# Patient Record
Sex: Male | Born: 1980
Health system: Southern US, Community
[De-identification: ages and names within clinical notes are randomized; demographics above are authoritative.]

## PROBLEM LIST (undated history)

## (undated) DIAGNOSIS — J449 Chronic obstructive pulmonary disease, unspecified: Secondary | ICD-10-CM

## (undated) DIAGNOSIS — U099 Post covid-19 condition, unspecified: Secondary | ICD-10-CM

## (undated) HISTORY — DX: Post covid-19 condition, unspecified: U09.9

---
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
## 2018-02-01 ENCOUNTER — Encounter (HOSPITAL_COMMUNITY): Payer: Self-pay | Admitting: Emergency Medicine

## 2018-02-01 ENCOUNTER — Emergency Department (HOSPITAL_COMMUNITY)
Admission: EM | Admit: 2018-02-01 | Discharge: 2018-02-01 | Disposition: A | Discharge status: Home / Self Care | Payer: No Typology Code available for payment source | Attending: Emergency Medicine | Admitting: Emergency Medicine

## 2018-02-01 ENCOUNTER — Emergency Department (HOSPITAL_COMMUNITY): Payer: No Typology Code available for payment source

## 2018-02-01 DIAGNOSIS — F17200 Nicotine dependence, unspecified, uncomplicated: Secondary | ICD-10-CM | POA: Insufficient documentation

## 2018-02-01 DIAGNOSIS — R0789 Other chest pain: Secondary | ICD-10-CM | POA: Insufficient documentation

## 2018-02-01 LAB — CBC
HCT: 47.3 % (ref 39.0–52.0)
Hemoglobin: 15.8 g/dL (ref 13.0–17.0)
MCH: 32 pg (ref 26.0–34.0)
MCHC: 33.4 g/dL (ref 30.0–36.0)
MCV: 95.9 fL (ref 78.0–100.0)
PLATELETS: 134 10*3/uL — AB (ref 150–400)
RBC: 4.93 MIL/uL (ref 4.22–5.81)
RDW: 12.6 % (ref 11.5–15.5)
WBC: 10.6 10*3/uL — AB (ref 4.0–10.5)

## 2018-02-01 LAB — BASIC METABOLIC PANEL
Anion gap: 9 (ref 5–15)
BUN: 13 mg/dL (ref 6–20)
CALCIUM: 9.3 mg/dL (ref 8.9–10.3)
CO2: 27 mmol/L (ref 22–32)
CREATININE: 0.98 mg/dL (ref 0.61–1.24)
Chloride: 100 mmol/L (ref 98–111)
Glucose, Bld: 113 mg/dL — ABNORMAL HIGH (ref 70–99)
Potassium: 3.6 mmol/L (ref 3.5–5.1)
Sodium: 136 mmol/L (ref 135–145)

## 2018-02-01 LAB — I-STAT TROPONIN, ED
TROPONIN I, POC: 0.01 ng/mL (ref 0.00–0.08)
Troponin i, poc: 0 ng/mL (ref 0.00–0.08)

## 2018-02-01 NOTE — Discharge Instructions (Addendum)
You were seen today for chest pain.  Your work-up is reassuring.  Follow-up with cardiology as needed.  You should abstain from smoking.

## 2018-02-01 NOTE — ED Triage Notes (Signed)
Patient reports intermittent mid/left chest pain for 2 days , mild SOB and occasional dry cough , denies emesis or diaphoresis , pain radiating to upper back .

## 2018-02-01 NOTE — ED Provider Notes (Signed)
[facility] EMERGENCY DEPARTMENT Provider Note   CSN: [id number] Arrival date & time: 02/01/18  0210     History   Chief Complaint Chief Complaint  Patient presents with  . Chest Pain    HPI Jason Hurst is a 37 y.o. male.  HPI  This is a 37 year old male who presents with chest pain.  Patient reports that earlier this evening he was watching TV when he had sudden onset of pressure-like chest pain at times radiated to his back.  Denies any ripping or tearing nature.  States that it seemed to come and go.  It was not associated with exertion or eating.  Denies any shortness of breath or diaphoresis.  No history of similar pain in the past.  Currently he is pain-free.  He reports last episode of chest pain was approximately 1 hour prior to arrival.  He has a history of smoking.  No known history of high blood pressure high cholesterol, diabetes, early family history of heart disease.  Patient denies any leg swelling, history of blood clots, recent travel, recent hospitalization.  History reviewed. No pertinent past medical history.  There are no active problems to display for this patient.   History reviewed. No pertinent surgical history.      Home Medications    Prior to Admission medications   Not on File    Family History No family history on file.  Social History Social History   Tobacco Use  . Smoking status: Current Every Day Smoker  . Smokeless tobacco: Never Used  Substance Use Topics  . Alcohol use: Yes  . Drug use: Yes    Types: Marijuana     Allergies   Patient has no known allergies.   Review of Systems Review of Systems  Constitutional: Negative for fever.  Respiratory: Negative for cough and shortness of breath.   Cardiovascular: Positive for chest pain. Negative for leg swelling.  Gastrointestinal: Negative for abdominal pain, nausea and vomiting.  Genitourinary: Negative for dysuria.  All other systems reviewed and  are negative.    Physical Exam Updated Vital Signs BP 113/81   Pulse (!) 50   Temp 97.7 F (36.5 C) (Oral)   Resp 15   Ht 1.778 m (5\' 10" )   Wt 88.5 kg   SpO2 98%   BMI 27.98 kg/m   Physical Exam  Constitutional: He is oriented to person, place, and time. He appears well-developed and well-nourished. No distress.  HENT:  Head: Normocephalic and atraumatic.  Eyes: Pupils are equal, round, and reactive to light.  Neck: Neck supple.  Cardiovascular: Normal rate, regular rhythm, normal heart sounds and normal pulses.  No murmur heard. Pulmonary/Chest: Effort normal and breath sounds normal. No respiratory distress. He has no wheezes.  Abdominal: Soft. Bowel sounds are normal. There is no tenderness. There is no rebound.  Musculoskeletal: He exhibits no edema.       Right lower leg: He exhibits no tenderness and no edema.       Left lower leg: He exhibits no tenderness and no edema.  Lymphadenopathy:    He has no cervical adenopathy.  Neurological: He is alert and oriented to person, place, and time.  Skin: Skin is warm and dry.  Psychiatric: He has a normal mood and affect.  Nursing note and vitals reviewed.    ED Treatments / Results  Labs (all labs ordered are listed, but only abnormal results are displayed) Labs Reviewed  BASIC METABOLIC PANEL - Abnormal;  Notable for the following components:      Result Value   Glucose, Bld 113 (*)    All other components within normal limits  CBC - Abnormal; Notable for the following components:   WBC 10.6 (*)    Platelets 134 (*)    All other components within normal limits  I-STAT TROPONIN, ED  I-STAT TROPONIN, ED    EKG EKG Interpretation  Date/Time:  Tuesday February 01 2018 02:11:54 EDT Ventricular Rate:  76 PR Interval:  142 QRS Duration: 94 QT Interval:  380 QTC Calculation: 427 R Axis:   80 Text Interpretation:  Normal sinus rhythm Normal ECG Confirmed by Ross Marcus ([id number]) on 02/01/2018 3:09:19  AM   Radiology Dg Chest 2 View  Result Date: 02/01/2018 CLINICAL DATA:  Chest pain EXAM: CHEST - 2 VIEW COMPARISON:  None. FINDINGS: Lungs are clear.  No pleural effusion or pneumothorax. The heart is normal in size. Visualized osseous structures are within normal limits. IMPRESSION: Normal chest radiographs. Electronically Signed   By: Charline Bills M.D.   On: 02/01/2018 02:47    Procedures Procedures (including critical care time)  Medications Ordered in ED Medications - No data to display   Initial Impression / Assessment and Plan / ED Course  I have reviewed the triage vital signs and the nursing notes.  Pertinent labs & imaging results that were available during my care of the patient were reviewed by me and considered in my medical decision making (see chart for details).     Patient presents with chest pain.  He is overall nontoxic-appearing and vital signs are reassuring.  EKG shows no signs of ischemia.  Patient is low risk for ACS.  Heart score is 1 for smoking.  Troponin is negative x2.  Patient is PERC negative.  X-ray shows no evidence of pneumothorax or pneumonia.  Patient has not had any recurrence of symptoms while in the ED.  Unclear etiology; however, feel patient is safe for discharge home and outpatient follow-up.  After history, exam, and medical workup I feel the patient has been appropriately medically screened and is safe for discharge home. Pertinent diagnoses were discussed with the patient. Patient was given return precautions.   Final Clinical Impressions(s) / ED Diagnoses   Final diagnoses:  Atypical chest pain    ED Discharge Orders    None       Shon Baton, MD 02/01/18 [phone]

## 2018-03-08 ENCOUNTER — Ambulatory Visit (INDEPENDENT_AMBULATORY_CARE_PROVIDER_SITE_OTHER): Payer: No Typology Code available for payment source | Admitting: Family Medicine

## 2018-03-08 ENCOUNTER — Encounter: Payer: Self-pay | Admitting: Family Medicine

## 2018-03-08 VITALS — BP 118/74 | HR 76 | Temp 98.0°F | Ht 70.0 in | Wt 207.4 lb

## 2018-03-08 DIAGNOSIS — Z0001 Encounter for general adult medical examination with abnormal findings: Secondary | ICD-10-CM | POA: Diagnosis not present

## 2018-03-08 DIAGNOSIS — Z716 Tobacco abuse counseling: Secondary | ICD-10-CM

## 2018-03-08 LAB — COMPREHENSIVE METABOLIC PANEL
ALT: 18 U/L (ref 0–53)
AST: 14 U/L (ref 0–37)
Albumin: 4.3 g/dL (ref 3.5–5.2)
Alkaline Phosphatase: 69 U/L (ref 39–117)
BILIRUBIN TOTAL: 0.6 mg/dL (ref 0.2–1.2)
BUN: 9 mg/dL (ref 6–23)
CALCIUM: 9.5 mg/dL (ref 8.4–10.5)
CO2: 27 meq/L (ref 19–32)
Chloride: 103 mEq/L (ref 96–112)
Creatinine, Ser: 0.89 mg/dL (ref 0.40–1.50)
GFR: 103.01 mL/min (ref >60.00)
Glucose, Bld: 113 mg/dL — ABNORMAL HIGH (ref 70–99)
Potassium: 4.1 mEq/L (ref 3.5–5.1)
Sodium: 138 mEq/L (ref 135–145)
Total Protein: 6.9 g/dL (ref 6.0–8.3)

## 2018-03-08 LAB — URINALYSIS, ROUTINE W REFLEX MICROSCOPIC
Bilirubin Urine: NEGATIVE
Hgb urine dipstick: NEGATIVE
Ketones, ur: NEGATIVE
Leukocytes, UA: NEGATIVE
Nitrite: NEGATIVE
RBC / HPF: NONE SEEN
Specific Gravity, Urine: 1.015 (ref 1.000–1.030)
Total Protein, Urine: NEGATIVE
Urine Glucose: NEGATIVE
Urobilinogen, UA: 0.2 (ref 0.0–1.0)
WBC, UA: NONE SEEN
pH: 8 (ref 5.0–8.0)

## 2018-03-08 LAB — CBC
HEMATOCRIT: 46.9 % (ref 39.0–52.0)
Hemoglobin: 16.2 g/dL (ref 13.0–17.0)
MCHC: 34.6 g/dL (ref 30.0–36.0)
MCV: 93.4 fl (ref 78.0–100.0)
PLATELETS: 131 10*3/uL — AB (ref 150.0–400.0)
RBC: 5.02 Mil/uL (ref 4.22–5.81)
RDW: 12.8 % (ref 11.5–15.5)
WBC: 7 10*3/uL (ref 4.0–10.5)

## 2018-03-08 LAB — LIPID PANEL
Cholesterol: 175 mg/dL (ref 0–200)
HDL: 42.9 mg/dL
LDL Cholesterol: 108 mg/dL — ABNORMAL HIGH (ref 0–99)
NonHDL: 132.42
Total CHOL/HDL Ratio: 4
Triglycerides: 120 mg/dL (ref 0.0–149.0)
VLDL: 24 mg/dL (ref 0.0–40.0)

## 2018-03-08 MED ORDER — VARENICLINE TARTRATE 0.5 MG X 11 & 1 MG X 42 PO MISC: ORAL | 0 refills | Status: DC

## 2018-03-08 NOTE — Patient Instructions (Signed)
Health Maintenance, Male A healthy lifestyle and preventive care is important for your health and wellness. Ask your health care provider about what schedule of regular examinations is right for you. What should I know about weight and diet? Eat a Healthy Diet  Eat plenty of vegetables, fruits, whole grains, low-fat dairy products, and lean protein.  Do not eat a lot of foods high in solid fats, added sugars, or salt.  Maintain a Healthy Weight Regular exercise can help you achieve or maintain a healthy weight. You should:  Do at least 150 minutes of exercise each week. The exercise should increase your heart rate and make you sweat (moderate-intensity exercise).  Do strength-training exercises at least twice a week.  Watch Your Levels of Cholesterol and Blood Lipids  Have your blood tested for lipids and cholesterol every 5 years starting at 35 years of age. If you are at high risk for heart disease, you should start having your blood tested when you are 35 years old. You may need to have your cholesterol levels checked more often if: ? Your lipid or cholesterol levels are high. ? You are older than 35 years of age. ? You are at high risk for heart disease.  What should I know about cancer screening? Many types of cancers can be detected early and may often be prevented. Lung Cancer  You should be screened every year for lung cancer if: ? You are a current smoker who has smoked for at least 30 years. ? You are a former smoker who has quit within the past 15 years.  Talk to your health care provider about your screening options, when you should start screening, and how often you should be screened.  Colorectal Cancer  Routine colorectal cancer screening usually begins at 35 years of age and should be repeated every 5-10 years until you are 35 years old. You may need to be screened more often if early forms of precancerous polyps or small growths are found. Your health care provider  may recommend screening at an earlier age if you have risk factors for colon cancer.  Your health care provider may recommend using home test kits to check for hidden blood in the stool.  A small camera at the end of a tube can be used to examine your colon (sigmoidoscopy or colonoscopy). This checks for the earliest forms of colorectal cancer.  Prostate and Testicular Cancer  Depending on your age and overall health, your health care provider may do certain tests to screen for prostate and testicular cancer.  Talk to your health care provider about any symptoms or concerns you have about testicular or prostate cancer.  Skin Cancer  Check your skin from head to toe regularly.  Tell your health care provider about any new moles or changes in moles, especially if: ? There is a change in a mole's size, shape, or color. ? You have a mole that is larger than a pencil eraser.  Always use sunscreen. Apply sunscreen liberally and repeat throughout the day.  Protect yourself by wearing long sleeves, pants, a wide-brimmed hat, and sunglasses when outside.  What should I know about heart disease, diabetes, and high blood pressure?  If you are 18-39 years of age, have your blood pressure checked every 3-5 years. If you are 40 years of age or older, have your blood pressure checked every year. You should have your blood pressure measured twice-once when you are at a hospital or clinic, and once when   you are not at a hospital or clinic. Record the average of the two measurements. To check your blood pressure when you are not at a hospital or clinic, you can use: ? An automated blood pressure machine at a pharmacy. ? A home blood pressure monitor.  Talk to your health care provider about your target blood pressure.  If you are between 83-34 years old, ask your health care provider if you should take aspirin to prevent heart disease.  Have regular diabetes screenings by checking your fasting blood  sugar level. ? If you are at a normal weight and have a low risk for diabetes, have this test once every three years after the age of 41. ? If you are overweight and have a high risk for diabetes, consider being tested at a younger age or more often.  A one-time screening for abdominal aortic aneurysm (AAA) by ultrasound is recommended for men aged 65-75 years who are current or former smokers. What should I know about preventing infection? Hepatitis B If you have a higher risk for hepatitis B, you should be screened for this virus. Talk with your health care provider to find out if you are at risk for hepatitis B infection. Hepatitis C Blood testing is recommended for:  Everyone born from [birth year] through 1963.  Anyone with known risk factors for hepatitis C.  Sexually Transmitted Diseases (STDs)  You should be screened each year for STDs including gonorrhea and chlamydia if: ? You are sexually active and are younger than 35 years of age. ? You are older than 35 years of age and your health care provider tells you that you are at risk for this type of infection. ? Your sexual activity has changed since you were last screened and you are at an increased risk for chlamydia or gonorrhea. Ask your health care provider if you are at risk.  Talk with your health care provider about whether you are at high risk of being infected with HIV. Your health care provider may recommend a prescription medicine to help prevent HIV infection.  What else can I do?  Schedule regular health, dental, and eye exams.  Stay current with your vaccines (immunizations).  Do not use any tobacco products, such as cigarettes, chewing tobacco, and e-cigarettes. If you need help quitting, ask your health care provider.  Limit alcohol intake to no more than 2 drinks per day. One drink equals 12 ounces of beer, 5 ounces of wine, or 1 ounces of hard liquor.  Do not use street drugs.  Do not share needles.  Ask your  health care provider for help if you need support or information about quitting drugs.  Tell your health care provider if you often feel depressed.  Tell your health care provider if you have ever been abused or do not feel safe at home. This information is not intended to replace advice given to you by your health care provider. Make sure you discuss any questions you have with your health care provider. Document Released: 10/17/2007 Document Revised: 12/18/2015 Document Reviewed: 01/22/2015 Elsevier Interactive Patient Education  2018 [employer].  Coping with Quitting Smoking Quitting smoking is a physical and mental challenge. You will face cravings, withdrawal symptoms, and temptation. Before quitting, work with your health care provider to make a plan that can help you cope. Preparation can help you quit and keep you from giving in. How can I cope with cravings? Cravings usually last for 5-10 minutes. If you get through  it, the craving will pass. Consider taking the following actions to help you cope with cravings:  Keep your mouth busy: ? Chew sugar-free gum. ? Suck on hard candies or a straw. ? Brush your teeth.  Keep your hands and body busy: ? Immediately change to a different activity when you feel a craving. ? Squeeze or play with a ball. ? Do an activity or a hobby, like making bead jewelry, practicing needlepoint, or working with wood. ? Mix up your normal routine. ? Take a short exercise break. Go for a quick walk or run up and down stairs. ? Spend time in public places where smoking is not allowed.  Focus on doing something kind or helpful for someone else.  Call a friend or family member to talk during a craving.  Join a support group.  Call a quit line, such as 1-800-QUIT-NOW.  Talk with your health care provider about medicines that might help you cope with cravings and make quitting easier for you.  How can I deal with withdrawal symptoms? Your body may  experience negative effects as it tries to get used to not having nicotine in the system. These effects are called withdrawal symptoms. They may include:  Feeling hungrier than normal.  Trouble concentrating.  Irritability.  Trouble sleeping.  Feeling depressed.  Restlessness and agitation.  Craving a cigarette.  To manage withdrawal symptoms:  Avoid places, people, and activities that trigger your cravings.  Remember why you want to quit.  Get plenty of sleep.  Avoid coffee and other caffeinated drinks. These may worsen some of your symptoms.  How can I handle social situations? Social situations can be difficult when you are quitting smoking, especially in the first few weeks. To manage this, you can:  Avoid parties, bars, and other social situations where people might be smoking.  Avoid alcohol.  Leave right away if you have the urge to smoke.  Explain to your family and friends that you are quitting smoking. Ask for understanding and support.  Plan activities with friends or family where smoking is not an option.  What are some ways I can cope with stress? Wanting to smoke may cause stress, and stress can make you want to smoke. Find ways to manage your stress. Relaxation techniques can help. For example:  Breathe slowly and deeply, in through your nose and out through your mouth.  Listen to soothing, relaxing music.  Talk with a family member or friend about your stress.  Light a candle.  Soak in a bath or take a shower.  Think about a peaceful place.  What are some ways I can prevent weight gain? Be aware that many people gain weight after they quit smoking. However, not everyone does. To keep from gaining weight, have a plan in place before you quit and stick to the plan after you quit. Your plan should include:  Having healthy snacks. When you have a craving, it may help to: ? Eat plain popcorn, crunchy carrots, celery, or other cut  vegetables. ? Chew sugar-free gum.  Changing how you eat: ? Eat small portion sizes at meals. ? Eat 4-6 small meals throughout the day instead of 1-2 large meals a day. ? Be mindful when you eat. Do not watch television or do other things that might distract you as you eat.  Exercising regularly: ? Make time to exercise each day. If you do not have time for a long workout, do short bouts of exercise for 5-10 minutes  several times a day. ? Do some form of strengthening exercise, like weight lifting, and some form of aerobic exercise, like running or swimming.  Drinking plenty of water or other low-calorie or no-calorie drinks. Drink 6-8 glasses of water daily, or as much as instructed by your health care provider.  Summary  Quitting smoking is a physical and mental challenge. You will face cravings, withdrawal symptoms, and temptation to smoke again. Preparation can help you as you go through these challenges.  You can cope with cravings by keeping your mouth busy (such as by chewing gum), keeping your body and hands busy, and making calls to family, friends, or a helpline for people who want to quit smoking.  You can cope with withdrawal symptoms by avoiding places where people smoke, avoiding drinks with caffeine, and getting plenty of rest.  Ask your health care provider about the different ways to prevent weight gain, avoid stress, and handle social situations. This information is not intended to replace advice given to you by your health care provider. Make sure you discuss any questions you have with your health care provider. Document Released: 04/17/2016 Document Revised: 04/17/2016 Document Reviewed: 04/17/2016 Elsevier Interactive Patient Education  [employer].

## 2018-03-08 NOTE — Progress Notes (Addendum)
Subjective:  Patient ID: Jason Hurst, male    DOB: 1980/11/03  Age: 37 y.o. MRN: [id number]  CC: Establish Care   HPI Jason Hurst presents for a physical exam and he is currently motivated to quit smoking.  He has been smoking for 15 years.  He is having no shortness of breath or dyspnea on exertion.  He is looking at his father and seeing himself when he gets to be his dad's age.  Dad is 83 and has drank and smoked heavily throughout his life.  He is currently dealing with sleep apnea, atrial fib hypertension and obesity.  Mom is about 60 as well.  She also drinks heavily.  Patient drinks very occasionally he occasionally smokes marijuana.  He lives with his wife and 27-year-old son.  He shares custody with his ex-4 year old son.  They are also raising a nephew.  Patient works in the Electrical engineer for [employer].  He he engages in no dedicated physical activity.  Patient is aware that his father smoking in lead to a lot of his personal problems.  No outpatient medications prior to visit.   No facility-administered medications prior to visit.     ROS Review of Systems  Constitutional: Negative.   HENT: Negative.   Eyes: Negative for photophobia and visual disturbance.  Respiratory: Negative.  Negative for chest tightness and shortness of breath.   Cardiovascular: Negative.   Gastrointestinal: Negative.   Endocrine: Negative for polyphagia and polyuria.  Genitourinary: Negative.   Musculoskeletal: Negative for gait problem and joint swelling.  Skin: Negative for pallor.  Neurological: Negative for weakness and light-headedness.  Hematological: Does not bruise/bleed easily.  Psychiatric/Behavioral: Negative.     Objective:  BP 118/74 (BP Location: Left Arm, Patient Position: Sitting, Cuff Size: Normal)   Pulse 76   Temp 98 F (36.7 C) (Oral)   Ht 5\' 10"  (1.778 m)   Wt 207 lb 6 oz (94.1 kg)   SpO2 98%   BMI 29.76 kg/m   BP Readings from Last 3 Encounters:    03/08/18 118/74  02/01/18 113/81    Wt Readings from Last 3 Encounters:  03/08/18 207 lb 6 oz (94.1 kg)  02/01/18 195 lb (88.5 kg)    Physical Exam  Constitutional: He is oriented to person, place, and time. He appears well-developed and well-nourished. No distress.  HENT:  Head: Normocephalic and atraumatic.  Right Ear: External ear normal.  Left Ear: External ear normal.  Nose: Nose normal.  Mouth/Throat: Oropharynx is clear and moist. No oropharyngeal exudate.  Eyes: Pupils are equal, round, and reactive to light. Conjunctivae and EOM are normal. Right eye exhibits no discharge. Left eye exhibits no discharge. No scleral icterus.  Neck: Neck supple. No JVD present. No tracheal deviation present. No thyromegaly present.  Cardiovascular: Normal rate, regular rhythm and normal heart sounds.  Pulmonary/Chest: Effort normal and breath sounds normal.  Abdominal: Soft. Bowel sounds are normal. He exhibits no distension and no mass. There is no tenderness. There is no guarding. Hernia confirmed negative in the right inguinal area and confirmed negative in the left inguinal area.  Genitourinary: Testes normal and penis normal. Right testis shows no mass, no swelling and no tenderness. Left testis shows no mass, no swelling and no tenderness. Circumcised. No hypospadias, penile erythema or penile tenderness. No discharge found.  Musculoskeletal: He exhibits no edema.  Lymphadenopathy:    He has no cervical adenopathy. No inguinal adenopathy noted on the right or left side.  Neurological: He is alert and oriented to person, place, and time.  Skin: Skin is warm and dry. He is not diaphoretic.  Psychiatric: He has a normal mood and affect. His behavior is normal. Thought content normal.   Depression screen [place] 2/9 03/08/2018  Decreased Interest 0  Down, Depressed, Hopeless 0  PHQ - 2 Score 0     Lab Results  Component Value Date   WBC 7.0 03/08/2018   HGB 16.2 03/08/2018   HCT 46.9  03/08/2018   PLT 131.0 (L) 03/08/2018   GLUCOSE 113 (H) 03/08/2018   CHOL 175 03/08/2018   TRIG 120.0 03/08/2018   HDL 42.90 03/08/2018   LDLCALC 108 (H) 03/08/2018   ALT 18 03/08/2018   AST 14 03/08/2018   NA 138 03/08/2018   K 4.1 03/08/2018   CL 103 03/08/2018   CREATININE 0.89 03/08/2018   BUN 9 03/08/2018   CO2 27 03/08/2018    Dg Chest 2 View  Result Date: 02/01/2018 CLINICAL DATA:  Chest pain EXAM: CHEST - 2 VIEW COMPARISON:  None. FINDINGS: Lungs are clear.  No pleural effusion or pneumothorax. The heart is normal in size. Visualized osseous structures are within normal limits. IMPRESSION: Normal chest radiographs. Electronically Signed   By: Charline Bills M.D.   On: 02/01/2018 02:47    Assessment & Plan:   Finlee was seen today for establish care.  Diagnoses and all orders for this visit:  Encounter for health maintenance examination with abnormal findings -     CBC -     Comprehensive metabolic panel -     Lipid panel -     Urinalysis, Routine w reflex microscopic  Tobacco abuse counseling -     varenicline (CHANTIX PAK) 0.5 MG X 11 & 1 MG X 42 tablet; Take one 0.5 mg tablet by mouth once daily for 3 days, then increase to one 0.5 mg tablet twice daily for 4 days, then increase to one 1 mg tablet twice daily.   I am having Jason Argyle. Profitt "JIMMY" start on varenicline.  Meds ordered this encounter  Medications  . varenicline (CHANTIX PAK) 0.5 MG X 11 & 1 MG X 42 tablet    Sig: Take one 0.5 mg tablet by mouth once daily for 3 days, then increase to one 0.5 mg tablet twice daily for 4 days, then increase to one 1 mg tablet twice daily.    Dispense:  53 tablet    Refill:  0   Patient was given a prescription for Chantix.  We discussed side effects of depression and/or homicidal thoughts.  He will return 1 month after starting the pack.  He is quite aware of the risks of smoking to include heart disease and lung cancer.  He wants to be alive to see his  grandchildren.  He was given anticipatory guidance for health maintenance and disease prevention.  Health maintenance labs are being drawn today.  Advised patient to engage in dedicated exercise for 30 minutes 5 days a week.  Follow-up: Return in about 6 months (around 09/06/2018), or return one month after starting chantix pack.Mliss Sax, MD

## 2018-03-16 ENCOUNTER — Encounter: Payer: Self-pay | Admitting: Family Medicine

## 2018-03-16 DIAGNOSIS — Z716 Tobacco abuse counseling: Secondary | ICD-10-CM

## 2018-03-16 MED ORDER — VARENICLINE TARTRATE 0.5 MG X 11 & 1 MG X 42 PO MISC: ORAL | 0 refills | Status: DC

## 2018-03-16 MED FILL — CHANTIX STARTING MONTH BOX: 0.5 MG X 11 | 30 days supply | Qty: 53 | Fill #0

## 2018-04-05 ENCOUNTER — Encounter: Payer: Self-pay | Admitting: Family Medicine

## 2018-04-06 ENCOUNTER — Ambulatory Visit: Payer: No Typology Code available for payment source | Admitting: Family Medicine

## 2018-04-07 ENCOUNTER — Ambulatory Visit: Payer: No Typology Code available for payment source | Admitting: Family Medicine

## 2018-04-28 ENCOUNTER — Encounter: Payer: Self-pay | Admitting: Family Medicine

## 2018-04-28 ENCOUNTER — Ambulatory Visit (INDEPENDENT_AMBULATORY_CARE_PROVIDER_SITE_OTHER): Payer: No Typology Code available for payment source | Admitting: Family Medicine

## 2018-04-28 VITALS — BP 118/80 | HR 92 | Ht 70.0 in | Wt 211.4 lb

## 2018-04-28 DIAGNOSIS — Z79899 Other long term (current) drug therapy: Secondary | ICD-10-CM

## 2018-04-28 DIAGNOSIS — Z720 Tobacco use: Secondary | ICD-10-CM

## 2018-04-28 MED ORDER — VARENICLINE TARTRATE 1 MG PO TABS: 1.0000 mg | ORAL_TABLET | Freq: Two times a day (BID) | ORAL | 1 refills | Status: AC

## 2018-04-28 MED FILL — CHANTIX 1 MG CONT MONTH BOX: 1 | 28 days supply | Qty: 56 | Fill #0

## 2018-04-28 NOTE — Progress Notes (Signed)
Established Patient Office Visit  Subjective:  Patient ID: Jason Hurst, male    DOB: May 11, 1980  Age: 37 y.o. MRN: [id number]  CC:  Chief Complaint  Patient presents with  . Follow-up    HPI Jason Hurst presents for   for follow-up status post starting Chantix 3 weeks ago.  Jason Hurst is doing okay with the drug.  Jason Hurst denies any depression or anger or homicidal thinking.  Jason Hurst continues to smoke some but it is much reduced amount.  Fortunately his insurance did cover the drug.  Remains busy at work.  His wife is happy that Jason Hurst is making a strong effort to quit.  On occasion Jason Hurst has some restlessness at night but overall is doing well on the drug.  History reviewed. No pertinent past medical history.  History reviewed. No pertinent surgical history.  History reviewed. No pertinent family history.  Social History   Socioeconomic History  . Marital status: Married    Spouse name: Not on file  . Number of children: Not on file  . Years of education: Not on file  . Highest education level: Not on file  Occupational History  . Not on file  Social Needs  . Financial resource strain: Not on file  . Food insecurity:    Worry: Not on file    Inability: Not on file  . Transportation needs:    Medical: Not on file    Non-medical: Not on file  Tobacco Use  . Smoking status: Current Every Day Smoker  . Smokeless tobacco: Never Used  Substance and Sexual Activity  . Alcohol use: Yes    Comment: very occasionally.   . Drug use: Yes    Types: Marijuana  . Sexual activity: Not on file  Lifestyle  . Physical activity:    Days per week: Not on file    Minutes per session: Not on file  . Stress: Not on file  Relationships  . Social connections:    Talks on phone: Not on file    Gets together: Not on file    Attends religious service: Not on file    Active member of club or organization: Not on file    Attends meetings of clubs or organizations: Not on file    Relationship status:  Not on file  . Intimate partner violence:    Fear of current or ex partner: Not on file    Emotionally abused: Not on file    Physically abused: Not on file    Forced sexual activity: Not on file  Other Topics Concern  . Not on file  Social History Narrative  . Not on file    Outpatient Medications Prior to Visit  Medication Sig Dispense Refill  . varenicline (CHANTIX PAK) 0.5 MG X 11 & 1 MG X 42 tablet Take 0.5 mg tab by mouth once for 3 days, then increase to one 0.5 mg tab bid daily for 4 days, then increase to one 1 mg tab bid daily. 53 tablet 0   No facility-administered medications prior to visit.     No Known Allergies  ROS Review of Systems  Constitutional: Negative.   Neurological: Negative.   Hematological: Negative.   Psychiatric/Behavioral: Negative for behavioral problems, dysphoric mood, self-injury and suicidal ideas. The patient is not nervous/anxious.       Objective:    Physical Exam  Constitutional: Jason Hurst is oriented to person, place, and time. Jason Hurst appears well-developed and well-nourished. No distress.  HENT:  Head: Normocephalic and atraumatic.  Right Ear: External ear normal.  Left Ear: External ear normal.  Eyes: Conjunctivae are normal. Right eye exhibits no discharge. Left eye exhibits no discharge. No scleral icterus.  Neck: No JVD present. No tracheal deviation present.  Pulmonary/Chest: Effort normal. No stridor.  Neurological: Jason Hurst is alert and oriented to person, place, and time.  Skin: Skin is warm and dry. Jason Hurst is not diaphoretic.  Psychiatric: Jason Hurst has a normal mood and affect. His behavior is normal.    BP 118/80   Pulse 92   Ht 5\' 10"  (1.778 m)   Wt 211 lb 6 oz (95.9 kg)   SpO2 95%   BMI 30.33 kg/m  Wt Readings from Last 3 Encounters:  04/28/18 211 lb 6 oz (95.9 kg)  03/08/18 207 lb 6 oz (94.1 kg)  02/01/18 195 lb (88.5 kg)   BP Readings from Last 3 Encounters:  04/28/18 118/80  03/08/18 118/74  02/01/18 113/81   Guideline  developer:  UpToDate (see UpToDate for funding source) Date Released: June 2014  Health Maintenance Due  Topic Date Due  . HIV Screening  07/17/1997  . TETANUS/TDAP  07/17/2001  . INFLUENZA VACCINE  12/02/2017    There are no preventive care reminders to display for this patient.  No results found for: TSH Lab Results  Component Value Date   WBC 7.0 03/08/2018   HGB 16.2 03/08/2018   HCT 46.9 03/08/2018   MCV 93.4 03/08/2018   PLT 131.0 (L) 03/08/2018   Lab Results  Component Value Date   NA 138 03/08/2018   K 4.1 03/08/2018   CO2 27 03/08/2018   GLUCOSE 113 (H) 03/08/2018   BUN 9 03/08/2018   CREATININE 0.89 03/08/2018   BILITOT 0.6 03/08/2018   ALKPHOS 69 03/08/2018   AST 14 03/08/2018   ALT 18 03/08/2018   PROT 6.9 03/08/2018   ALBUMIN 4.3 03/08/2018   CALCIUM 9.5 03/08/2018   ANIONGAP 9 02/01/2018   GFR 103.01 03/08/2018   Lab Results  Component Value Date   CHOL 175 03/08/2018   Lab Results  Component Value Date   HDL 42.90 03/08/2018   Lab Results  Component Value Date   LDLCALC 108 (H) 03/08/2018   Lab Results  Component Value Date   TRIG 120.0 03/08/2018   Lab Results  Component Value Date   CHOLHDL 4 03/08/2018   No results found for: HGBA1C    Assessment & Plan:   Problem List Items Addressed This Visit      Other   Tobacco abuse disorder - Primary   Relevant Medications   varenicline (CHANTIX) 1 MG tablet   On Chantix therapy      Meds ordered this encounter  Medications  . varenicline (CHANTIX) 1 MG tablet    Sig: Take 1 tablet (1 mg total) by mouth 2 (two) times daily.    Dispense:  60 tablet    Refill:  1    Follow-up: No follow-ups on file.   Patient will continue the Chantix for another month.  Jason Hurst is planning on not smoking past the first at all.  Jason Hurst will follow-up with me immediately with any depression or anger issues.  Anticipatory guidance was given again on Chantix.

## 2018-04-28 NOTE — Patient Instructions (Signed)
Varenicline oral tablets What is this medicine? VARENICLINE (var EN i kleen) is used to help people quit smoking. It is used with a patient support program recommended by your physician. This medicine may be used for other purposes; ask your health care provider or pharmacist if you have questions. COMMON BRAND NAME(S): Chantix What should I tell my health care provider before I take this medicine? They need to know if you have any of these conditions: -heart disease -if you often drink alcohol -kidney disease -mental illness -on hemodialysis -seizures -history of stroke -suicidal thoughts, plans, or attempt; a previous suicide attempt by you or a family member -an unusual or allergic reaction to varenicline, other medicines, foods, dyes, or preservatives -pregnant or trying to get pregnant -breast-feeding How should I use this medicine? Take this medicine by mouth after eating. Take with a full glass of water. Follow the directions on the prescription label. Take your doses at regular intervals. Do not take your medicine more often than directed. There are 3 ways you can use this medicine to help you quit smoking; talk to your health care professional to decide which plan is right for you: 1) you can choose a quit date and start this medicine 1 week before the quit date, or, 2) you can start taking this medicine before you choose a quit date, and then pick a quit date between day 8 and 35 days of treatment, or, 3) if you are not sure that you are able or willing to quit smoking right away, start taking this medicine and slowly decrease the amount you smoke as directed by your health care professional with the goal of being cigarette-free by week 12 of treatment. Stick to your plan; ask about support groups or other ways to help you remain cigarette-free. If you are motivated to quit smoking and did not succeed during a previous attempt with this medicine for reasons other than side effects,  or if you returned to smoking after this treatment, speak with your health care professional about whether another course of this medicine may be right for you. A special MedGuide will be given to you by the pharmacist with each prescription and refill. Be sure to read this information carefully each time. Talk to your pediatrician regarding the use of this medicine in children. This medicine is not approved for use in children. Overdosage: If you think you have taken too much of this medicine contact a poison control center or emergency room at once. NOTE: This medicine is only for you. Do not share this medicine with others. What if I miss a dose? If you miss a dose, take it as soon as you can. If it is almost time for your next dose, take only that dose. Do not take double or extra doses. What may interact with this medicine? -alcohol -insulin -other medicines used to help people quit smoking -theophylline -warfarin This list may not describe all possible interactions. Give your health care provider a list of all the medicines, herbs, non-prescription drugs, or dietary supplements you use. Also tell them if you smoke, drink alcohol, or use illegal drugs. Some items may interact with your medicine. What should I watch for while using this medicine? It is okay if you do not succeed at your attempt to quit and have a cigarette. You can still continue your quit attempt and keep using this medicine as directed. Just throw away your cigarettes and get back to your quit plan. Talk to your health   care provider before using other treatments to quit smoking. Using this medicine with other treatments to quit smoking may increase the risk for side effects compared to using a treatment alone. You may get drowsy or dizzy. Do not drive, use machinery, or do anything that needs mental alertness until you know how this medicine affects you. Do not stand or sit up quickly, especially if you are an older patient.  This reduces the risk of dizzy or fainting spells. Decrease the number of alcoholic beverages that you drink during treatment with this medicine until you know if this medicine affects your ability to tolerate alcohol. Some people have experienced increased drunkenness (intoxication), unusual or sometimes aggressive behavior, or no memory of things that have happened (amnesia) during treatment with this medicine. Sleepwalking can happen during treatment with this medicine, and can sometimes lead to behavior that is harmful to you, other people, or property. Stop taking this medicine and tell your doctor if you start sleepwalking or have other unusual sleep-related activity. After taking this medicine, you may get up out of bed and do an activity that you do not know you are doing. The next morning, you may have no memory of this. Activities include driving a car ("sleep-driving"), making and eating food, talking on the phone, sexual activity, and sleep-walking. Serious injuries have occurred. Stop the medicine and call your doctor right away if you find out you have done any of these activities. Do not take this medicine if you have used alcohol that evening. Do not take it if you have taken another medicine for sleep. The risk of doing these sleep-related activities is higher. Patients and their families should watch out for new or worsening depression or thoughts of suicide. Also watch out for sudden changes in feelings such as feeling anxious, agitated, panicky, irritable, hostile, aggressive, impulsive, severely restless, overly excited and hyperactive, or not being able to sleep. If this happens, call your health care professional. If you have diabetes and you quit smoking, the effects of insulin may be increased and you may need to reduce your insulin dose. Check with your doctor or health care professional about how you should adjust your insulin dose. What side effects may I notice from receiving this  medicine? Side effects that you should report to your doctor or health care professional as soon as possible: -allergic reactions like skin rash, itching or hives, swelling of the face, lips, tongue, or throat -acting aggressive, being angry or violent, or acting on dangerous impulses -breathing problems -changes in emotions or moods -chest pain or chest tightness -feeling faint or lightheaded, falls -hallucination, loss of contact with reality -mouth sores -redness, blistering, peeling or loosening of the skin, including inside the mouth -signs and symptoms of a stroke like changes in vision; confusion; trouble speaking or understanding; severe headaches; sudden numbness or weakness of the face, arm or leg; trouble walking; dizziness; loss of balance or coordination -seizures -sleepwalking -suicidal thoughts or other mood changes Side effects that usually do not require medical attention (report to your doctor or health care professional if they continue or are bothersome): -constipation -gas -headache -nausea, vomiting -strange dreams -trouble sleeping This list may not describe all possible side effects. Call your doctor for medical advice about side effects. You may report side effects to FDA at 1-800-FDA-1088. Where should I keep my medicine? Keep out of the reach of children. Store at room temperature between 15 and 30 degrees C (59 and 86 degrees F). Throw away   any unused medicine after the expiration date. NOTE: This sheet is a summary. It may not cover all possible information. If you have questions about this medicine, talk to your doctor, pharmacist, or health care provider.  2019 Elsevier/Gold Standard (2017-10-15 12:48:08)  

## 2018-05-30 ENCOUNTER — Ambulatory Visit (INDEPENDENT_AMBULATORY_CARE_PROVIDER_SITE_OTHER): Payer: No Typology Code available for payment source | Admitting: Family Medicine

## 2018-05-30 ENCOUNTER — Encounter: Payer: Self-pay | Admitting: Family Medicine

## 2018-05-30 VITALS — BP 110/70 | HR 64 | Ht 70.0 in | Wt 217.1 lb

## 2018-05-30 DIAGNOSIS — Z79899 Other long term (current) drug therapy: Secondary | ICD-10-CM | POA: Diagnosis not present

## 2018-05-30 DIAGNOSIS — Z720 Tobacco use: Secondary | ICD-10-CM | POA: Diagnosis not present

## 2018-05-30 MED ORDER — VARENICLINE TARTRATE 1 MG PO TABS: 1.0000 mg | ORAL_TABLET | Freq: Two times a day (BID) | ORAL | 1 refills | Status: DC

## 2018-05-30 MED FILL — CHANTIX 1 MG TABLET: 1 | 30 days supply | Qty: 60 | Fill #0

## 2018-05-30 NOTE — Progress Notes (Signed)
Established Patient Office Visit  Subjective:  Patient ID: Jason Hurst, male    DOB: Feb 24, 1981  Age: 38 y.o. MRN: [id number]  CC:  Chief Complaint  Patient presents with  . Follow-up    HPI Jason Hurst presents for follow-up status post starting Chantix.  Drug is worked well for him.  He has not had a cigarette in over 3 weeks.  Denies depression or anger issues with the drug.  He is here with his wife today who confirms.  His appetite is improved since stopping smoking and he has gained a little bit of weight.  History reviewed. No pertinent past medical history.  History reviewed. No pertinent surgical history.  History reviewed. No pertinent family history.  Social History   Socioeconomic History  . Marital status: Married    Spouse name: Not on file  . Number of children: Not on file  . Years of education: Not on file  . Highest education level: Not on file  Occupational History  . Not on file  Social Needs  . Financial resource strain: Not on file  . Food insecurity:    Worry: Not on file    Inability: Not on file  . Transportation needs:    Medical: Not on file    Non-medical: Not on file  Tobacco Use  . Smoking status: Current Every Day Smoker  . Smokeless tobacco: Never Used  Substance and Sexual Activity  . Alcohol use: Yes    Comment: very occasionally.   . Drug use: Yes    Types: Marijuana  . Sexual activity: Not on file  Lifestyle  . Physical activity:    Days per week: Not on file    Minutes per session: Not on file  . Stress: Not on file  Relationships  . Social connections:    Talks on phone: Not on file    Gets together: Not on file    Attends religious service: Not on file    Active member of club or organization: Not on file    Attends meetings of clubs or organizations: Not on file    Relationship status: Not on file  . Intimate partner violence:    Fear of current or ex partner: Not on file    Emotionally abused: Not on  file    Physically abused: Not on file    Forced sexual activity: Not on file  Other Topics Concern  . Not on file  Social History Narrative  . Not on file    No outpatient medications prior to visit.   No facility-administered medications prior to visit.     No Known Allergies  ROS Review of Systems  Constitutional: Positive for appetite change. Negative for activity change and unexpected weight change.  Respiratory: Negative.   Cardiovascular: Negative.   Gastrointestinal: Negative.   Psychiatric/Behavioral: Negative for behavioral problems and dysphoric mood.      Objective:    Physical Exam  Constitutional: He is oriented to person, place, and time. He appears well-developed and well-nourished. No distress.  HENT:  Head: Normocephalic and atraumatic.  Right Ear: External ear normal.  Left Ear: External ear normal.  Eyes: Right eye exhibits no discharge. Left eye exhibits no discharge. No scleral icterus.  Pulmonary/Chest: Effort normal.  Neurological: He is alert and oriented to person, place, and time.  Skin: He is not diaphoretic.  Psychiatric: He has a normal mood and affect. His behavior is normal.    BP 110/70  Pulse 64   Ht 5\' 10"  (1.778 m)   Wt 217 lb 2 oz (98.5 kg)   SpO2 98%   BMI 31.15 kg/m  Wt Readings from Last 3 Encounters:  05/30/18 217 lb 2 oz (98.5 kg)  04/28/18 211 lb 6 oz (95.9 kg)  03/08/18 207 lb 6 oz (94.1 kg)   BP Readings from Last 3 Encounters:  05/30/18 110/70  04/28/18 118/80  03/08/18 118/74   Guideline developer:  UpToDate (see UpToDate for funding source) Date Released: June 2014  Health Maintenance Due  Topic Date Due  . HIV Screening  07/17/1997  . TETANUS/TDAP  07/17/2001  . INFLUENZA VACCINE  12/02/2017    There are no preventive care reminders to display for this patient.  No results found for: TSH Lab Results  Component Value Date   WBC 7.0 03/08/2018   HGB 16.2 03/08/2018   HCT 46.9 03/08/2018   MCV  93.4 03/08/2018   PLT 131.0 (L) 03/08/2018   Lab Results  Component Value Date   NA 138 03/08/2018   K 4.1 03/08/2018   CO2 27 03/08/2018   GLUCOSE 113 (H) 03/08/2018   BUN 9 03/08/2018   CREATININE 0.89 03/08/2018   BILITOT 0.6 03/08/2018   ALKPHOS 69 03/08/2018   AST 14 03/08/2018   ALT 18 03/08/2018   PROT 6.9 03/08/2018   ALBUMIN 4.3 03/08/2018   CALCIUM 9.5 03/08/2018   ANIONGAP 9 02/01/2018   GFR 103.01 03/08/2018   Lab Results  Component Value Date   CHOL 175 03/08/2018   Lab Results  Component Value Date   HDL 42.90 03/08/2018   Lab Results  Component Value Date   LDLCALC 108 (H) 03/08/2018   Lab Results  Component Value Date   TRIG 120.0 03/08/2018   Lab Results  Component Value Date   CHOLHDL 4 03/08/2018   No results found for: HGBA1C    Assessment & Plan:   Problem List Items Addressed This Visit      Other   Tobacco abuse disorder - Primary   Relevant Medications   varenicline (CHANTIX) 1 MG tablet   On Chantix therapy   Relevant Medications   varenicline (CHANTIX) 1 MG tablet      Meds ordered this encounter  Medications  . varenicline (CHANTIX) 1 MG tablet    Sig: Take 1 tablet (1 mg total) by mouth 2 (two) times daily.    Dispense:  60 tablet    Refill:  1    Follow-up: Return in about 1 year (around 05/31/2019), or if symptoms worsen or fail to improve.   Patient will continue Chantix for 2 more months then discontinue.  He will follow-up with any issues with the drug.  Discussed his elevated fasting glucose.  He was given information for exercising to maintain health.  Encouraged him to maintain a healthy lifestyle physical activity in addition to that which he obtains with his work.  Recheck in 1 year.

## 2018-05-30 NOTE — Patient Instructions (Signed)

## 2018-08-03 MED FILL — IBUPROFEN 600 MG TABLET: 600 | 7 days supply | Qty: 21 | Fill #0

## 2018-08-03 MED FILL — CHLORHEXIDINE 0.12% RINSE: 0.12 | 15 days supply | Qty: 473 | Fill #0

## 2018-08-03 MED FILL — HYDROCODON-APAP 5-325: 5-325 | 2 days supply | Qty: 12 | Fill #0

## 2018-08-25 ENCOUNTER — Telehealth: Payer: Self-pay | Admitting: Family Medicine

## 2018-08-25 NOTE — Telephone Encounter (Signed)
Called and could not leave vm for patient. Calling to schedule virtual visit with Kremer °

## 2018-08-27 ENCOUNTER — Telehealth: Payer: No Typology Code available for payment source | Admitting: Nurse Practitioner

## 2018-08-27 DIAGNOSIS — J029 Acute pharyngitis, unspecified: Secondary | ICD-10-CM

## 2018-08-27 NOTE — Progress Notes (Signed)

## 2018-11-11 ENCOUNTER — Telehealth: Payer: Self-pay

## 2018-11-11 ENCOUNTER — Encounter: Payer: Self-pay | Admitting: Family Medicine

## 2018-11-11 DIAGNOSIS — Z20822 Contact with and (suspected) exposure to covid-19: Secondary | ICD-10-CM

## 2018-11-11 NOTE — Telephone Encounter (Signed)
Dr. Ethelene Hal request COVID 19 test for exposure. Scheduled for 7.13.20.

## 2018-11-14 ENCOUNTER — Other Ambulatory Visit: Payer: No Typology Code available for payment source

## 2018-11-14 DIAGNOSIS — Z20822 Contact with and (suspected) exposure to covid-19: Secondary | ICD-10-CM

## 2018-11-17 LAB — NOVEL CORONAVIRUS, NAA: SARS-CoV-2, NAA: NOT DETECTED

## 2018-11-21 ENCOUNTER — Ambulatory Visit: Payer: No Typology Code available for payment source | Admitting: Family Medicine

## 2019-05-29 ENCOUNTER — Encounter: Payer: Self-pay | Admitting: Family Medicine

## 2019-05-29 ENCOUNTER — Other Ambulatory Visit: Payer: Self-pay

## 2019-05-29 ENCOUNTER — Ambulatory Visit (INDEPENDENT_AMBULATORY_CARE_PROVIDER_SITE_OTHER): Payer: No Typology Code available for payment source | Admitting: Family Medicine

## 2019-05-29 VITALS — BP 120/68 | HR 83 | Temp 97.3°F | Ht 70.0 in | Wt 241.6 lb

## 2019-05-29 DIAGNOSIS — M7711 Lateral epicondylitis, right elbow: Secondary | ICD-10-CM

## 2019-05-29 DIAGNOSIS — M722 Plantar fascial fibromatosis: Secondary | ICD-10-CM | POA: Diagnosis not present

## 2019-05-29 MED ORDER — MELOXICAM 7.5 MG PO TABS: ORAL_TABLET | ORAL | 0 refills | Status: DC

## 2019-05-29 MED FILL — MELOXICAM 7.5 MG TABLET: 7.5 | 30 days supply | Qty: 30 | Fill #0

## 2019-05-29 NOTE — Progress Notes (Signed)
Established Patient Office Visit  Subjective:  Patient ID: Jason Hurst, male    DOB: Sep 17, 1980  Age: 39 y.o. MRN: [id number]  CC:  Chief Complaint  Patient presents with  . Pain    c/o pain in feet after he sits for a few and stand he experience pain both feet x 2-3 months, also c/o pain in right elbow x 3 week.     HPI Jason Hurst presents for evaluation and treatment of ongoing bilateral heel pain.  Pain is sometimes in the heel and up to the arch of his foot.  Worse after he sits for a period of time.  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  He is right-hand dominant.  He has noticed improvement after putting padding in his work boots buying a new pair of [employer] running shoes.  Is also having right elbow pain predominantly on the outside of the elbow.  It is the foot pain that bothers him the most.  He has gained some weight since he quit smoking.  History reviewed. No pertinent past medical history.  History reviewed. No pertinent surgical history.  History reviewed. No pertinent family history.  Social History   Socioeconomic History  . Marital status: Married    Spouse name: Not on file  . Number of children: Not on file  . Years of education: Not on file  . Highest education level: Not on file  Occupational History  . Not on file  Tobacco Use  . Smoking status: Current Every Day Smoker  . Smokeless tobacco: Never Used  Substance and Sexual Activity  . Alcohol use: Yes    Comment: very occasionally.   . Drug use: Yes    Types: Marijuana  . Sexual activity: Not on file  Other Topics Concern  . Not on file  Social History Narrative  . Not on file   Social Determinants of Health   Financial Resource Strain:   . Difficulty of Paying Living Expenses: Not on file  Food Insecurity:   . Worried About Programme researcher, broadcasting/film/video in the Last Year: Not on file  . Ran Out of Food in the Last Year: Not on file  Transportation Needs:   . Lack  of Transportation (Medical): Not on file  . Lack of Transportation (Non-Medical): Not on file  Physical Activity:   . Days of Exercise per Week: Not on file  . Minutes of Exercise per Session: Not on file  Stress:   . Feeling of Stress : Not on file  Social Connections:   . Frequency of Communication with Friends and Family: Not on file  . Frequency of Social Gatherings with Friends and Family: Not on file  . Attends Religious Services: Not on file  . Active Member of Clubs or Organizations: Not on file  . Attends Banker Meetings: Not on file  . Marital Status: Not on file  Intimate Partner Violence:   . Fear of Current or Ex-Partner: Not on file  . Emotionally Abused: Not on file  . Physically Abused: Not on file  . Sexually Abused: Not on file    Outpatient Medications Prior to Visit  Medication Sig Dispense Refill  . varenicline (CHANTIX) 1 MG tablet Take 1 tablet (1 mg total) by mouth 2 (two) times daily. (Patient not taking: Reported on 05/29/2019) 60 tablet 1   No facility-administered medications prior to visit.    No Known Allergies  ROS  Review of Systems  Constitutional: Negative.   Respiratory: Negative.   Cardiovascular: Negative.   Gastrointestinal: Negative.   Musculoskeletal: Positive for arthralgias and gait problem. Negative for joint swelling.  Neurological: Negative for weakness and numbness.  Psychiatric/Behavioral: Negative.       Objective:    Physical Exam  Constitutional: He is oriented to person, place, and time. He appears well-developed and well-nourished. No distress.  HENT:  Head: Normocephalic and atraumatic.  Left Ear: External ear normal.  Eyes: Right eye exhibits no discharge. Left eye exhibits no discharge. No scleral icterus.  Neck: No JVD present. No tracheal deviation present.  Cardiovascular:  Pulses:      Dorsalis pedis pulses are 2+ on the right side and 2+ on the left side.       Posterior tibial pulses are 2+  on the right side and 2+ on the left side.  Pulmonary/Chest: Effort normal. No stridor.  Musculoskeletal:     Left foot: Normal range of motion. No tenderness or bony tenderness.       Feet:  Neurological: He is alert and oriented to person, place, and time.  Skin: Skin is warm and dry. He is not diaphoretic.  Psychiatric: He has a normal mood and affect. His behavior is normal.    BP 120/68   Pulse 83   Temp (!) 97.3 F (36.3 C) (Tympanic)   Ht 5\' 10"  (1.778 m)   Wt 241 lb 9.6 oz (109.6 kg)   SpO2 98%   BMI 34.67 kg/m  Wt Readings from Last 3 Encounters:  05/29/19 241 lb 9.6 oz (109.6 kg)  05/30/18 217 lb 2 oz (98.5 kg)  04/28/18 211 lb 6 oz (95.9 kg)     Health Maintenance Due  Topic Date Due  . HIV Screening  07/17/1997  . TETANUS/TDAP  07/17/2001    There are no preventive care reminders to display for this patient.  No results found for: TSH Lab Results  Component Value Date   WBC 7.0 03/08/2018   HGB 16.2 03/08/2018   HCT 46.9 03/08/2018   MCV 93.4 03/08/2018   PLT 131.0 (L) 03/08/2018   Lab Results  Component Value Date   NA 138 03/08/2018   K 4.1 03/08/2018   CO2 27 03/08/2018   GLUCOSE 113 (H) 03/08/2018   BUN 9 03/08/2018   CREATININE 0.89 03/08/2018   BILITOT 0.6 03/08/2018   ALKPHOS 69 03/08/2018   AST 14 03/08/2018   ALT 18 03/08/2018   PROT 6.9 03/08/2018   ALBUMIN 4.3 03/08/2018   CALCIUM 9.5 03/08/2018   ANIONGAP 9 02/01/2018   GFR 103.01 03/08/2018   Lab Results  Component Value Date   CHOL 175 03/08/2018   Lab Results  Component Value Date   HDL 42.90 03/08/2018   Lab Results  Component Value Date   LDLCALC 108 (H) 03/08/2018   Lab Results  Component Value Date   TRIG 120.0 03/08/2018   Lab Results  Component Value Date   CHOLHDL 4 03/08/2018   No results found for: HGBA1C    Assessment & Plan:   Problem List Items Addressed This Visit      Musculoskeletal and Integument   Lateral epicondylitis of right elbow     Relevant Medications   meloxicam (MOBIC) 7.5 MG tablet   Other Relevant Orders   Ambulatory referral to Sports Medicine   Plantar fasciitis, bilateral - Primary   Relevant Medications   meloxicam (MOBIC) 7.5 MG tablet   Other Relevant  Orders   Ambulatory referral to Sports Medicine      Meds ordered this encounter  Medications  . meloxicam (MOBIC) 7.5 MG tablet    Sig: Take one daily with food for 15 days and then as needed.    Dispense:  30 tablet    Refill:  0    Follow-up: No follow-ups on file.  Patient was given information on plantar fasciitis and lateral epicondylitis.  Discussed stretches, elbow strap, ice therapy and the possibility of shoe inserts.  We will see sports medicine for further follow-up of these and needed therapy modalities.  Mliss Sax, MD

## 2019-05-29 NOTE — Patient Instructions (Signed)
Plantar Fasciitis  Plantar fasciitis is a painful foot condition that affects the heel. It occurs when the band of tissue that connects the toes to the heel bone (plantar fascia) becomes irritated. This can happen as the result of exercising too much or doing other repetitive activities (overuse injury). The pain from plantar fasciitis can range from mild irritation to severe pain that makes it difficult to walk or move. The pain is usually worse in the morning after sleeping, or after sitting or lying down for a while. Pain may also be worse after long periods of walking or standing. What are the causes? This condition may be caused by:  Standing for long periods of time.  Wearing shoes that do not have good arch support.  Doing activities that put stress on joints (high-impact activities), including running, aerobics, and ballet.  Being overweight.  An abnormal way of walking (gait).  Tight muscles in the back of your lower leg (calf).  High arches in your feet.  Starting a new athletic activity. What are the signs or symptoms? The main symptom of this condition is heel pain. Pain may:  Be worse with first steps after a time of rest, especially in the morning after sleeping or after you have been sitting or lying down for a while.  Be worse after long periods of standing still.  Decrease after 30-45 minutes of activity, such as gentle walking. How is this diagnosed? This condition may be diagnosed based on your medical history and your symptoms. Your health care provider may ask questions about your activity level. Your health care provider will do a physical exam to check for:  A tender area on the bottom of your foot.  A high arch in your foot.  Pain when you move your foot.  Difficulty moving your foot. You may have imaging tests to confirm the diagnosis, such as:  X-rays.  Ultrasound.  MRI. How is this treated? Treatment for plantar fasciitis depends on how  severe your condition is. Treatment may include:  Rest, ice, applying pressure (compression), and raising the affected foot (elevation). This may be called RICE therapy. Your health care provider may recommend RICE therapy along with over-the-counter pain medicines to manage your pain.  Exercises to stretch your calves and your plantar fascia.  A splint that holds your foot in a stretched, upward position while you sleep (night splint).  Physical therapy to relieve symptoms and prevent problems in the future.  Injections of steroid medicine (cortisone) to relieve pain and inflammation.  Stimulating your plantar fascia with electrical impulses (extracorporeal shock wave therapy). This is usually the last treatment option before surgery.  Surgery, if other treatments have not worked after 12 months. Follow these instructions at home:  Managing pain, stiffness, and swelling  If directed, put ice on the painful area: ? Put ice in a plastic bag, or use a frozen bottle of water. ? Place a towel between your skin and the bag or bottle. ? Roll the bottom of your foot over the bag or bottle. ? Do this for 20 minutes, 2-3 times a day.  Wear athletic shoes that have air-sole or gel-sole cushions, or try wearing soft shoe inserts that are designed for plantar fasciitis.  Raise (elevate) your foot above the level of your heart while you are sitting or lying down. Activity  Avoid activities that cause pain. Ask your health care provider what activities are safe for you.  Do physical therapy exercises and stretches as told   by your health care provider.  Try activities and forms of exercise that are easier on your joints (low-impact). Examples include swimming, water aerobics, and biking. General instructions  Take over-the-counter and prescription medicines only as told by your health care provider.  Wear a night splint while sleeping, if told by your health care provider. Loosen the splint  if your toes tingle, become numb, or turn cold and blue.  Maintain a healthy weight, or work with your health care provider to lose weight as needed.  Keep all follow-up visits as told by your health care provider. This is important. Contact a health care provider if you:  Have symptoms that do not go away after caring for yourself at home.  Have pain that gets worse.  Have pain that affects your ability to move or do your daily activities. Summary  Plantar fasciitis is a painful foot condition that affects the heel. It occurs when the band of tissue that connects the toes to the heel bone (plantar fascia) becomes irritated.  The main symptom of this condition is heel pain that may be worse after exercising too much or standing still for a long time.  Treatment varies, but it usually starts with rest, ice, compression, and elevation (RICE therapy) and over-the-counter medicines to manage pain. This information is not intended to replace advice given to you by your health care provider. Make sure you discuss any questions you have with your health care provider. Document Revised: 04/02/2017 Document Reviewed: 02/15/2017 Elsevier Patient Education  2020 [employer].  Tennis Elbow  Tennis elbow (lateral epicondylitis) is inflammation of tendons in your outer forearm, near your elbow. Tendons are tissues that connect muscle to bone. When you have tennis elbow, inflammation affects the tendons that you use to bend your wrist and move your hand up. Inflammation occurs in the lower part of the upper arm bone (humerus), where the tendons connect to the bone (lateral epicondyle). Tennis elbow often affects people who play tennis, but anyone may get the condition from repeatedly extending the wrist or turning the forearm. What are the causes? This condition is usually caused by repeatedly extending the wrist, turning the forearm, and using the hands. It can result from sports or work that  requires repetitive forearm movements. In some cases, it may be caused by a sudden injury. What increases the risk? You are more likely to develop tennis elbow if you play tennis or another racket sport. You also have a higher risk if you frequently use your hands for work. Besides people who play tennis, others at greater risk include:  Musicians.  Carpenters, painters, and plumbers.  Cooks.  Cashiers.  People who work in [employer].  Holiday representative workers.  Butchers.  People who use computers. What are the signs or symptoms? Symptoms of this condition include:  Pain and tenderness in the forearm and the outer part of the elbow. Pain may be felt only when using the arm, or it may be there all the time.  A burning feeling that starts in the elbow and spreads down the forearm.  A weak grip in the hand. How is this diagnosed? This condition may be diagnosed based on:  Your symptoms and medical history.  A physical exam.  X-rays.  MRI. How is this treated? Resting and icing your arm is often the first treatment. Your health care provider may also recommend:  Medicines to reduce pain and inflammation. These may be in the form of a pill, topical gels, or  shots of a steroid medicine (cortisone).  An elbow strap to reduce stress on the area.  Physical therapy. This may include massage or exercises.  An elbow brace to restrict the movements that cause symptoms. If these treatments do not help relieve your symptoms, your health care provider may recommend surgery to remove damaged muscle and reattach healthy muscle to bone. Follow these instructions at home: Activity  Rest your elbow and wrist and avoid activities that cause symptoms, as told by your health care provider.  Do physical therapy exercises as instructed.  If you lift an object, lift it with your palm facing up. This reduces stress on your elbow. Lifestyle  If your tennis elbow is caused by sports, check  your equipment and make sure that: ? You are using it correctly. ? It is the best fit for you.  If your tennis elbow is caused by work or computer use, take frequent breaks to stretch your arm. Talk with your manager about ways to manage your condition at work. If you have a brace:  Wear the brace or strap as told by your health care provider. Remove it only as told by your health care provider.  Loosen the brace if your fingers tingle, become numb, or turn cold and blue.  Keep the brace clean.  If the brace is not waterproof, ask if you may remove it for bathing. If you must keep the brace on while bathing: ? Do not let it get wet. ? Cover it with a watertight covering when you take a bath or a shower. General instructions   If directed, put ice on the painful area: ? Put ice in a plastic bag. ? Place a towel between your skin and the bag. ? Leave the ice on for 20 minutes, 2-3 times a day.  Take over-the-counter and prescription medicines only as told by your health care provider.  Keep all follow-up visits as told by your health care provider. This is important. Contact a health care provider if:  You have pain that gets worse or does not get better with treatment.  You have numbness or weakness in your forearm, hand, or fingers. Summary  Tennis elbow (lateral epicondylitis) is inflammation of tendons in your outer forearm, near your elbow.  Common symptoms include pain and tenderness in your forearm and the outer part of your elbow.  This condition is usually caused by repeatedly extending your wrist, turning your forearm, and using your hands.  The first treatment is often resting and icing your arm to relieve symptoms. Further treatment may include taking medicine, getting physical therapy, wearing a brace or strap, or having surgery. This information is not intended to replace advice given to you by your health care provider. Make sure you discuss any questions you  have with your health care provider. Document Revised: 01/14/2018 Document Reviewed: 02/02/2017 Elsevier Patient Education  2020 [employer]  Plantar Fasciitis Rehab Ask your health care provider which exercises are safe for you. Do exercises exactly as told by your health care provider and adjust them as directed. It is normal to feel mild stretching, pulling, tightness, or discomfort as you do these exercises. Stop right away if you feel sudden pain or your pain gets worse. Do not begin these exercises until told by your health care provider. Stretching and range-of-motion exercises These exercises warm up your muscles and joints and improve the movement and flexibility of your foot. These exercises also help to relieve pain. Plantar fascia stretch  1. Sit with your left / right leg crossed over your opposite knee. 2. Hold your heel with one hand with that thumb near your arch. With your other hand, hold your toes and gently pull them back toward the top of your foot. You should feel a stretch on the bottom of your toes or your foot (plantar fascia) or both. 3. Hold this stretch for__________ seconds. 4. Slowly release your toes and return to the starting position. Repeat __________ times. Complete this exercise __________ times a day. Gastrocnemius stretch, standing This exercise is also called a calf (gastroc) stretch. It stretches the muscles in the back of the upper calf. 1. Stand with your hands against a wall. 2. Extend your left / right leg behind you, and bend your front knee slightly. 3. Keeping your heels on the floor and your back knee straight, shift your weight toward the wall. Do not arch your back. You should feel a gentle stretch in your upper left / right calf. 4. Hold this position for __________ seconds. Repeat __________ times. Complete this exercise __________ times a day. Soleus stretch, standing This exercise is also called a calf (soleus) stretch. It stretches the  muscles in the back of the lower calf. 1. Stand with your hands against a wall. 2. Extend your left / right leg behind you, and bend your front knee slightly. 3. Keeping your heels on the floor, bend your back knee and shift your weight slightly over your back leg. You should feel a gentle stretch deep in your lower calf. 4. Hold this position for __________ seconds. Repeat __________ times. Complete this exercise __________ times a day. Gastroc and soleus stretch, standing step This exercise stretches the muscles in the back of the lower leg. These muscles are in the upper calf (gastrocnemius) and the lower calf (soleus). 1. Stand with the ball of your left / right foot on a step. The ball of your foot is on the walking surface, right under your toes. 2. Keep your other foot firmly on the same step. 3. Hold on to the wall or a railing for balance. 4. Slowly lift your other foot, allowing your body weight to press your left / right heel down over the edge of the step. You should feel a stretch in your left / right calf. 5. Hold this position for __________ seconds. 6. Return both feet to the step. 7. Repeat this exercise with a slight bend in your left / right knee. Repeat __________ times with your left / right knee straight and __________ times with your left / right knee bent. Complete this exercise __________ times a day. Balance exercise This exercise builds your balance and strength control of your arch to help take pressure off your plantar fascia. Single leg stand If this exercise is too easy, you can try it with your eyes closed or while standing on a pillow. 1. Without shoes, stand near a railing or in a doorway. You may hold on to the railing or door frame as needed. 2. Stand on your left / right foot. Keep your big toe down on the floor and try to keep your arch lifted. Do not let your foot roll inward. 3. Hold this position for __________ seconds. Repeat __________ times. Complete  this exercise __________ times a day. This information is not intended to replace advice given to you by your health care provider. Make sure you discuss any questions you have with your health care provider. Document Revised: 08/11/2018  Document Reviewed: 02/16/2018 Elsevier Patient Education  [employer].

## 2019-06-14 ENCOUNTER — Encounter: Payer: Self-pay | Admitting: Sports Medicine

## 2019-06-14 ENCOUNTER — Other Ambulatory Visit: Payer: Self-pay

## 2019-06-14 ENCOUNTER — Ambulatory Visit: Payer: No Typology Code available for payment source | Admitting: Sports Medicine

## 2019-06-14 VITALS — BP 124/82 | Ht 70.0 in | Wt 240.0 lb

## 2019-06-14 DIAGNOSIS — M722 Plantar fascial fibromatosis: Secondary | ICD-10-CM

## 2019-06-14 DIAGNOSIS — M7711 Lateral epicondylitis, right elbow: Secondary | ICD-10-CM

## 2019-06-14 NOTE — Progress Notes (Addendum)
   [facility] [address] 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   PERTINENT  PMH / PSH / FH / SH:  Past Medical, Surgical, Social, and Family History Reviewed & Updated in the EMR. Pertinent findings include:  Family history of plantar fasciitis, tobacco use   OBJECTIVE:  BP 124/82   Ht 5\' 10"  (1.778 m)   Wt 240 lb (108.9 kg)   BMI 34.44 kg/m  Physical Exam:  Vital signs are reviewed.   GEN: Alert and oriented, NAD Pulm: Breathing unlabored PSY: normal mood, congruent affect  MSK: Right foot No swelling or erythema Full ROM at ankle and toes Full strength at ankle and toes No TTP Negative compression test Normal transverse and medial arch NVI  Left foot No swelling or erythema Full ROM at ankle and toes Full strength at ankle and toes Mild TTP at plantar heel Negative compression test Normal transverse and medial arch NVI  Patient has mild supination with walking   Right Elbow: Unremarkable to inspection. Range of motion full pronation, supination, flexion,  extension. Strength is full to all of the above directions Stable to varus, valgus stress. Negative moving valgus stress test. TTP at lateral epicondyle. Ulnar nerve does not sublux. Negative cubital tunnel Tinel's. Pain with resisted third finger and wrist extension Neurovascularly intact distally   Limited MSK ultrasound Bilateral plantar fascia The left plantar fascia measures 0.45 cm with mild hypoechoic changes just distal to origin.  Right plantar fascia measures 0.38 cm with no hypoechoic changes or tear.  ASSESSMENT & PLAN:   1. Bilateral plantar fasciitis  Patient was fitted with green sports insoles (size 9.5) and small scaphoid pad.  He is to continue to use arch binder and was also given a home exercise program.  We will follow-up again in 1 month, at which time we can consider injections.  2.  Right lateral epicondylitis  Patient was fitted with counterforce brace today to wear when working and active.  He was also given a home exercise program to begin.  Recommended icing at the end of the day.  He will continue to use Mobic as needed.   , DO, [facility] Sports Medicine Fellow  Addendum:  I was the preceptor for this visit and available for immediate consultation.  Judge Stall MD [facility]

## 2019-07-12 ENCOUNTER — Ambulatory Visit: Payer: No Typology Code available for payment source | Admitting: Sports Medicine

## 2019-07-18 ENCOUNTER — Other Ambulatory Visit: Payer: Self-pay

## 2019-07-18 ENCOUNTER — Ambulatory Visit: Payer: No Typology Code available for payment source | Admitting: Sports Medicine

## 2019-07-18 VITALS — BP 118/84 | Ht 70.5 in | Wt 235.0 lb

## 2019-07-18 DIAGNOSIS — M722 Plantar fascial fibromatosis: Secondary | ICD-10-CM

## 2019-07-18 DIAGNOSIS — M7711 Lateral epicondylitis, right elbow: Secondary | ICD-10-CM

## 2019-07-18 NOTE — Patient Instructions (Addendum)
It was great to see you!  Our plans for today:  - We are referring you to physical therapy to help your elbow pain. Go to the hand physical therapist at [facility]. - If you still have pain about a month after seeing physical therapy, we would consider a platelet-rich plasma injection.  - Try topical voltaren gel for your elbow pain. You can get this at the pharmacy but don't need a prescription.  - If your feet pain gets worse, we would recommend custom orthotics. Make sure to call your insurance to see how much these would be. Continue your home stretching and exercises.   Take care and seek immediate care sooner if you develop any concerns.

## 2019-07-18 NOTE — Progress Notes (Addendum)
    SUBJECTIVE:   CHIEF COMPLAINT: plantar fasciitis and R elbow follow up  HPI:   Previously seen 2/10 for bilateral foot and R elbow pain thought to be secondary to plantar fasciitis and R lateral epicodylitis. Was provided with green sports insoles, small scaphoid pad, counterforce brace and home exercise program.   Today reports his feet pain is much improved. He is wearing the insoles and stretching daily as recommended. Still with occasional morning pain in R foot but overall is much improved.   Reports his lateral R elbow pain is the same and possibly worse. Didn't notice the counterforce brace to provide much relief. Noted sleepiness with the mobic and is now taking 800mg  ibuprofen BID. He has been doing the home stretches but has not been able to tolerate the strengthening exercises. 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 He also notes hitting his elbow on a door frame about a week ago with subsequent pain. Denies fevers, redness, swelling.   PERTINENT  PMH / PSH: tobacco use  OBJECTIVE:   BP 118/84   Ht 5' 10.5" (1.791 m)   Wt 235 lb (106.6 kg)   BMI 33.24 kg/m   Gen: well appearing, in NAD MSK:  Elbow: Inspection: no redness, swelling, obvious deformity Palpation: TTP over lateral epicondyle, nonTTP over extensor muscles ROM: full AROM Strength: 5/5 strength with resisted wrist extension/flexion, pronation, supination. Pain with resisted extension and supination. Neurovascular: intact  Feet: Inspection: no redness, swelling, obvious deformity or asymmetry Palpation: slightly TTP along R arch and lateral and medial sides of L heel.  ROM: Full AROM Neurovascular: intact  ASSESSMENT/PLAN:   Plantar fasciitis, bilateral Improved with insoles and home exercise program, recommend continuation. Advised if pain returns/worsens, consider custom inserts. Continue NSAIDs  prn.  Lateral epicondylitis of right elbow Persistent, likely 2/2 continued overuse with heavy lifting at work and renovations at home. As he is unable to tolerate home strengthening exercises, will refer to formal PT for strengthening as well as additional modalities to improve pain and function. Try voltaren gel for pain relief, ok to continue oral ibuprofen. If pain persists after good effort with PT, can consider PRP injections. Follow up in one month if has continued pain.    , DO PGY-3, [facility] 07/18/2019 9:40 AM    Patient seen and evaluated with the resident.  I agree with the above plan of care.  Plantar fasciitis is improving with green sports insoles and scaphoid pads.  We could consider custom orthotics at a later date if his symptoms return. For his lateral epicondylitis I recommended formal physical therapy.  He may wean to a home exercise program per the therapist discretion.  If symptoms persist then consider possible PRP injection.  Follow-up for ongoing or recalcitrant issues.

## 2019-07-18 NOTE — Assessment & Plan Note (Signed)
Improved with insoles and home exercise program, recommend continuation. Advised if pain returns/worsens, consider custom inserts. Continue NSAIDs prn.

## 2019-07-18 NOTE — Assessment & Plan Note (Signed)
Persistent, likely 2/2 continued overuse with heavy lifting at work and renovations at home. As he is unable to tolerate home strengthening exercises, will refer to formal PT for strengthening as well as additional modalities to improve pain and function. Try voltaren gel for pain relief, ok to continue oral ibuprofen. If pain persists after good effort with PT, can consider PRP injections. Follow up in one month if has continued pain.

## 2019-08-28 ENCOUNTER — Other Ambulatory Visit: Payer: Self-pay | Admitting: Family Medicine

## 2019-08-28 DIAGNOSIS — M722 Plantar fascial fibromatosis: Secondary | ICD-10-CM

## 2019-08-28 DIAGNOSIS — M7711 Lateral epicondylitis, right elbow: Secondary | ICD-10-CM

## 2019-08-28 MED ORDER — MELOXICAM 7.5 MG PO TABS: ORAL_TABLET | ORAL | 0 refills | Status: DC

## 2019-08-28 MED FILL — MELOXICAM 7.5 MG TABLET: 7.5 | 30 days supply | Qty: 30 | Fill #0

## 2019-08-28 NOTE — Telephone Encounter (Signed)
Last fill 05/29/19  #30/0 Last OV 07/18/19

## 2019-09-18 MED FILL — MELOXICAM 7.5 MG TABLET: 7.5 | 30 days supply | Qty: 30 | Fill #0

## 2019-12-26 MED FILL — MECLIZINE HCL 25 MG TABS: 25 | 7 days supply | Qty: 21 | Fill #0

## 2019-12-26 MED FILL — ONDANSETRON ODT 4 MG TABLET: 4 | 7 days supply | Qty: 21 | Fill #0

## 2019-12-29 ENCOUNTER — Telehealth: Payer: No Typology Code available for payment source | Admitting: Family

## 2019-12-29 DIAGNOSIS — U071 COVID-19: Secondary | ICD-10-CM

## 2019-12-29 MED ORDER — BENZONATATE 100 MG PO CAPS: 100.0000 mg | ORAL_CAPSULE | Freq: Three times a day (TID) | ORAL | 0 refills | Status: DC | PRN

## 2019-12-29 NOTE — Progress Notes (Signed)
E-Visit for Corona Virus Screening  We are sorry you are not feeling well. We are here to help!  You have tested positive for COVID-19, meaning that you were infected with the novel coronavirus and could give the germ to others.    You have been enrolled in MyChart Home Monitoring for COVID-19. Daily you will receive a questionnaire within the MyChart website. Our COVID-19 response team will be monitoring your responses daily.  Please continue isolation at home, for at least 10 days since the start of your symptoms and until you have had 24 hours with no fever (without taking a fever reducer) and with improving of symptoms.  Please continue good preventive care measures, including:  frequent hand-washing, avoid touching your face, cover coughs/sneezes, stay out of crowds and keep a 6 foot distance from others.  Follow up with your provider or go to the nearest hospital ED for re-assessment if fever/cough/breathlessness return.  The following symptoms may appear 2-14 days after exposure:  Fever  Cough  Shortness of breath or difficulty breathing  Chills  Repeated shaking with chills  Muscle pain  Headache  Sore throat  New loss of taste or smell  Fatigue  Congestion or runny nose  Nausea or vomiting  Diarrhea  Go to the nearest hospital ED for assessment if fever/cough/breathlessness are severe or illness seems like a threat to life.  It is vitally important that if you feel that you have an infection such as this virus or any other virus that you stay home and away from places where you may spread it to others.  You should avoid contact with people age 46 and older.   You can use medication such as A prescription cough medication called Tessalon Perles 100 mg. You may take 1-2 capsules every 8 hours as needed for cough.  ive also sent at work note to your my chart. I hope you feel better soon!  You may also take acetaminophen (Tylenol) as needed for fever.  Reduce  your risk of any infection by using the same precautions used for avoiding the common cold or flu:   Wash your hands often with soap and warm water for at least 20 seconds.  If soap and water are not readily available, use an alcohol-based hand sanitizer with at least 60% alcohol.   If coughing or sneezing, cover your mouth and nose by coughing or sneezing into the elbow areas of your shirt or coat, into a tissue or into your sleeve (not your hands).  Avoid shaking hands with others and consider head nods or verbal greetings only.  Avoid touching your eyes, nose, or mouth with unwashed hands.   Avoid close contact with people who are sick.  Avoid places or events with large numbers of people in one location, like concerts or sporting events.  Carefully consider travel plans you have or are making.  If you are planning any travel outside or inside the [place], visit the CDC's Travelers' Health webpage for the latest health notices.  If you have some symptoms but not all symptoms, continue to monitor at home and seek medical attention if your symptoms worsen.  If you are having a medical emergency, call 911.  HOME CARE  Only take medications as instructed by your medical team.  Drink plenty of fluids and get plenty of rest.  A steam or ultrasonic humidifier can help if you have congestion.   GET HELP RIGHT AWAY IF YOU HAVE EMERGENCY WARNING SIGNS** FOR COVID-19. If  you or someone is showing any of these signs seek emergency medical care immediately. Call 911 or proceed to your closest emergency facility if:  You develop worsening high fever.  Trouble breathing  Bluish lips or face  Persistent pain or pressure in the chest  New confusion  Inability to wake or stay awake  You cough up blood.  Your symptoms become more severe  **This list is not all possible symptoms. Contact your medical provider for any symptoms that are sever or concerning to you.  MAKE SURE YOU    Understand these instructions.  Will watch your condition.  Will get help right away if you are not doing well or get worse.  Your e-visit answers were reviewed by a board certified advanced clinical practitioner to complete your personal care plan.  Depending on the condition, your plan could have included both over the counter or prescription medications.  If there is a problem please reply once you have received a response from your provider.  Your safety is important to [place].  If you have drug allergies check your prescription carefully.    You can use MyChart to ask questions about today's visit, request a non-urgent call back, or ask for a work or school excuse for 24 hours related to this e-Visit. If it has been greater than 24 hours you will need to follow up with your provider, or enter a new e-Visit to address those concerns. You will get an e-mail in the next two days asking about your experience.  I hope that your e-visit has been valuable and will speed your recovery. Thank you for using e-visits.

## 2020-05-16 ENCOUNTER — Encounter: Payer: No Typology Code available for payment source | Admitting: Family Medicine

## 2020-06-13 ENCOUNTER — Encounter: Payer: No Typology Code available for payment source | Admitting: Family Medicine

## 2020-07-24 ENCOUNTER — Other Ambulatory Visit: Payer: Self-pay

## 2020-07-24 ENCOUNTER — Ambulatory Visit (INDEPENDENT_AMBULATORY_CARE_PROVIDER_SITE_OTHER): Payer: No Typology Code available for payment source | Admitting: Family Medicine

## 2020-07-24 ENCOUNTER — Encounter: Payer: Self-pay | Admitting: Family Medicine

## 2020-07-24 VITALS — BP 104/68 | HR 83 | Temp 98.2°F | Ht 70.0 in | Wt 234.6 lb

## 2020-07-24 DIAGNOSIS — R7309 Other abnormal glucose: Secondary | ICD-10-CM | POA: Diagnosis not present

## 2020-07-24 DIAGNOSIS — Z0000 Encounter for general adult medical examination without abnormal findings: Secondary | ICD-10-CM

## 2020-07-24 DIAGNOSIS — Z3009 Encounter for other general counseling and advice on contraception: Secondary | ICD-10-CM | POA: Insufficient documentation

## 2020-07-24 LAB — COMPREHENSIVE METABOLIC PANEL
ALT: 20 U/L (ref 0–53)
AST: 14 U/L (ref 0–37)
Albumin: 4.5 g/dL (ref 3.5–5.2)
Alkaline Phosphatase: 80 U/L (ref 39–117)
BUN: 13 mg/dL (ref 6–23)
CO2: 28 mEq/L (ref 19–32)
Calcium: 9.5 mg/dL (ref 8.4–10.5)
Chloride: 102 mEq/L (ref 96–112)
Creatinine, Ser: 0.81 mg/dL (ref 0.40–1.50)
GFR: 112.23 mL/min (ref >60.00)
Glucose, Bld: 108 mg/dL — ABNORMAL HIGH (ref 70–99)
Potassium: 4.1 mEq/L (ref 3.5–5.1)
Sodium: 137 mEq/L (ref 135–145)
Total Bilirubin: 0.6 mg/dL (ref 0.2–1.2)
Total Protein: 7.2 g/dL (ref 6.0–8.3)

## 2020-07-24 LAB — CBC
HCT: 44.7 % (ref 39.0–52.0)
Hemoglobin: 15.2 g/dL (ref 13.0–17.0)
MCHC: 34.1 g/dL (ref 30.0–36.0)
MCV: 91.4 fl (ref 78.0–100.0)
Platelets: 157 10*3/uL (ref 150.0–400.0)
RBC: 4.89 Mil/uL (ref 4.22–5.81)
RDW: 13.1 % (ref 11.5–15.5)
WBC: 7.6 10*3/uL (ref 4.0–10.5)

## 2020-07-24 LAB — URINALYSIS, ROUTINE W REFLEX MICROSCOPIC
Bilirubin Urine: NEGATIVE
Hgb urine dipstick: NEGATIVE
Ketones, ur: NEGATIVE
Leukocytes,Ua: NEGATIVE
Nitrite: NEGATIVE
RBC / HPF: NONE SEEN (ref 0–?)
Specific Gravity, Urine: 1.02 (ref 1.000–1.030)
Total Protein, Urine: NEGATIVE
Urine Glucose: NEGATIVE
Urobilinogen, UA: 0.2 (ref 0.0–1.0)
pH: 7.5 (ref 5.0–8.0)

## 2020-07-24 LAB — LIPID PANEL
Cholesterol: 189 mg/dL (ref 0–200)
HDL: 45.1 mg/dL (ref >39.00)
LDL Cholesterol: 112 mg/dL — ABNORMAL HIGH (ref 0–99)
NonHDL: 143.51
Total CHOL/HDL Ratio: 4
Triglycerides: 157 mg/dL — ABNORMAL HIGH (ref 0.0–149.0)
VLDL: 31.4 mg/dL (ref 0.0–40.0)

## 2020-07-24 LAB — HEMOGLOBIN A1C: Hgb A1c MFr Bld: 5.9 % (ref 4.6–6.5)

## 2020-07-24 NOTE — Patient Instructions (Signed)
Health Maintenance, Male Adopting a healthy lifestyle and getting preventive care are important in promoting health and wellness. Ask your health care provider about:  The right schedule for you to have regular tests and exams.  Things you can do on your own to prevent diseases and keep yourself healthy. What should I know about diet, weight, and exercise? Eat a healthy diet  Eat a diet that includes plenty of vegetables, fruits, low-fat dairy products, and lean protein.  Do not eat a lot of foods that are high in solid fats, added sugars, or sodium.   Maintain a healthy weight Body mass index (BMI) is a measurement that can be used to identify possible weight problems. It estimates body fat based on height and weight. Your health care provider can help determine your BMI and help you achieve or maintain a healthy weight. Get regular exercise Get regular exercise. This is one of the most important things you can do for your health. Most adults should:  Exercise for at least 150 minutes each week. The exercise should increase your heart rate and make you sweat (moderate-intensity exercise).  Do strengthening exercises at least twice a week. This is in addition to the moderate-intensity exercise.  Spend less time sitting. Even light physical activity can be beneficial. Watch cholesterol and blood lipids Have your blood tested for lipids and cholesterol at 38 years of age, then have this test every 5 years. You may need to have your cholesterol levels checked more often if:  Your lipid or cholesterol levels are high.  You are older than 38 years of age.  You are at high risk for heart disease. What should I know about cancer screening? Many types of cancers can be detected early and may often be prevented. Depending on your health history and family history, you may need to have cancer screening at various ages. This may include screening for:  Colorectal cancer.  Prostate  cancer.  Skin cancer.  Lung cancer. What should I know about heart disease, diabetes, and high blood pressure? Blood pressure and heart disease  High blood pressure causes heart disease and increases the risk of stroke. This is more likely to develop in people who have high blood pressure readings, are of African descent, or are overweight.  Talk with your health care provider about your target blood pressure readings.  Have your blood pressure checked: ? Every 3-5 years if you are 18-39 years of age. ? Every year if you are 40 years old or older.  If you are between the ages of 65 and 75 and are a current or former smoker, ask your health care provider if you should have a one-time screening for abdominal aortic aneurysm (AAA). Diabetes Have regular diabetes screenings. This checks your fasting blood sugar level. Have the screening done:  Once every three years after age 45 if you are at a normal weight and have a low risk for diabetes.  More often and at a younger age if you are overweight or have a high risk for diabetes. What should I know about preventing infection? Hepatitis B If you have a higher risk for hepatitis B, you should be screened for this virus. Talk with your health care provider to find out if you are at risk for hepatitis B infection. Hepatitis C Blood testing is recommended for:  Everyone born from 1945 through 1965.  Anyone with known risk factors for hepatitis C. Sexually transmitted infections (STIs)  You should be screened each   year for STIs, including gonorrhea and chlamydia, if: ? You are sexually active and are younger than 38 years of age. ? You are older than 38 years of age and your health care provider tells you that you are at risk for this type of infection. ? Your sexual activity has changed since you were last screened, and you are at increased risk for chlamydia or gonorrhea. Ask your health care provider if you are at risk.  Ask your  health care provider about whether you are at high risk for HIV. Your health care provider may recommend a prescription medicine to help prevent HIV infection. If you choose to take medicine to prevent HIV, you should first get tested for HIV. You should then be tested every 3 months for as long as you are taking the medicine. Follow these instructions at home: Lifestyle  Do not use any products that contain nicotine or tobacco, such as cigarettes, e-cigarettes, and chewing tobacco. If you need help quitting, ask your health care provider.  Do not use street drugs.  Do not share needles.  Ask your health care provider for help if you need support or information about quitting drugs. Alcohol use  Do not drink alcohol if your health care provider tells you not to drink.  If you drink alcohol: ? Limit how much you have to 0-2 drinks a day. ? Be aware of how much alcohol is in your drink. In the [place] one drink equals one 12 oz bottle of beer (355 mL), one 5 oz glass of wine (148 mL), or one 1 oz glass of hard liquor (44 mL). General instructions  Schedule regular health, dental, and eye exams.  Stay current with your vaccines.  Tell your health care provider if: ? You often feel depressed. ? You have ever been abused or do not feel safe at home. Summary  Adopting a healthy lifestyle and getting preventive care are important in promoting health and wellness.  Follow your health care provider's instructions about healthy diet, exercising, and getting tested or screened for diseases.  Follow your health care provider's instructions on monitoring your cholesterol and blood pressure. This information is not intended to replace advice given to you by your health care provider. Make sure you discuss any questions you have with your health care provider. Document Revised: 04/13/2018 Document Reviewed: 04/13/2018 Elsevier Patient Education  2021 [place] 21-39 Years  Old, Male Preventive care refers to lifestyle choices and visits with your health care provider that can promote health and wellness. This includes:  A yearly physical exam. This is also called an annual wellness visit.  Regular dental and eye exams.  Immunizations.  Screening for certain conditions.  Healthy lifestyle choices, such as: ? Eating a healthy diet. ? Getting regular exercise. ? Not using drugs or products that contain nicotine and tobacco. ? Limiting alcohol use. What can I expect for my preventive care visit? Physical exam Your health care provider may check your:  Height and weight. These may be used to calculate your BMI (body mass index). BMI is a measurement that tells if you are at a healthy weight.  Heart rate and blood pressure.  Body temperature.  Skin for abnormal spots. Counseling Your health care provider may ask you questions about your:  Past medical problems.  Family's medical history.  Alcohol, tobacco, and drug use.  Emotional well-being.  Home life and relationship well-being.  Sexual activity.  Diet, exercise, and sleep habits.  Work and work Astronomer.  Access to firearms. What immunizations do I need? Vaccines are usually given at various ages, according to a schedule. Your health care provider will recommend vaccines for you based on your age, medical history, and lifestyle or other factors, such as travel or where you work.   What tests do I need? Blood tests  Lipid and cholesterol levels. These may be checked every 5 years starting at age 49.  Hepatitis C test.  Hepatitis B test. Screening  Diabetes screening. This is done by checking your blood sugar (glucose) after you have not eaten for a while (fasting).  Genital exam to check for testicular cancer or hernias.  STD (sexually transmitted disease) testing, if you are at risk. Talk with your health care provider about your test results, treatment options, and if  necessary, the need for more tests.   Follow these instructions at home: Eating and drinking  Eat a healthy diet that includes fresh fruits and vegetables, whole grains, lean protein, and low-fat dairy products.  Drink enough fluid to keep your urine pale yellow.  Take vitamin and mineral supplements as recommended by your health care provider.  Do not drink alcohol if your health care provider tells you not to drink.  If you drink alcohol: ? Limit how much you have to 0-2 drinks a day. ? Be aware of how much alcohol is in your drink. In the [place] one drink equals one 12 oz bottle of beer (355 mL), one 5 oz glass of wine (148 mL), or one 1 oz glass of hard liquor (44 mL).   Lifestyle  Take daily care of your teeth and gums. Brush your teeth every morning and night with fluoride toothpaste. Floss one time each day.  Stay active. Exercise for at least 30 minutes 5 or more days each week.  Do not use any products that contain nicotine or tobacco, such as cigarettes, e-cigarettes, and chewing tobacco. If you need help quitting, ask your health care provider.  Do not use drugs.  If you are sexually active, practice safe sex. Use a condom or other form of protection to prevent STIs (sexually transmitted infections).  Find healthy ways to cope with stress, such as: ? Meditation, yoga, or listening to music. ? Journaling. ? Talking to a trusted person. ? Spending time with friends and family. Safety  Always wear your seat belt while driving or riding in a vehicle.  Do not drive: ? If you have been drinking alcohol. Do not ride with someone who has been drinking. ? When you are tired or distracted. ? While texting.  Wear a helmet and other protective equipment during sports activities.  If you have firearms in your house, make sure you follow all gun safety procedures.  Seek help if you have been physically or sexually abused. What's next?  Go to your health care provider once  a year for an annual wellness visit.  Ask your health care provider how often you should have your eyes and teeth checked.  Stay up to date on all vaccines. This information is not intended to replace advice given to you by your health care provider. Make sure you discuss any questions you have with your health care provider. Document Revised: 01/04/2019 Document Reviewed: 04/14/2018 Elsevier Patient Education  2021 [employer]  Calorie Counting for [employer] Calories are units of energy. Your body needs a certain number of calories from food to keep going throughout the day. When you eat  or drink more calories than your body needs, your body stores the extra calories mostly as fat. When you eat or drink fewer calories than your body needs, your body burns fat to get the energy it needs. Calorie counting means keeping track of how many calories you eat and drink each day. Calorie counting can be helpful if you need to lose weight. If you eat fewer calories than your body needs, you should lose weight. Ask your health care provider what a healthy weight is for you. For calorie counting to work, you will need to eat the right number of calories each day to lose a healthy amount of weight per week. A dietitian can help you figure out how many calories you need in a day and will suggest ways to reach your calorie goal.  A healthy amount of weight to lose each week is usually 1-2 lb (0.5-0.9 kg). This usually means that your daily calorie intake should be reduced by 500-750 calories.  Eating 1,200-1,500 calories a day can help most women lose weight.  Eating 1,500-1,800 calories a day can help most men lose weight. What do I need to know about calorie counting? Work with your health care provider or dietitian to determine how many calories you should get each day. To meet your daily calorie goal, you will need to:  Find out how many calories are in each food that you would like to eat. Try to  do this before you eat.  Decide how much of the food you plan to eat.  Keep a food log. Do this by writing down what you ate and how many calories it had. To successfully lose weight, it is important to balance calorie counting with a healthy lifestyle that includes regular activity. Where do I find calorie information? The number of calories in a food can be found on a Nutrition Facts label. If a food does not have a Nutrition Facts label, try to look up the calories online or ask your dietitian for help. Remember that calories are listed per serving. If you choose to have more than one serving of a food, you will have to multiply the calories per serving by the number of servings you plan to eat. For example, the label on a package of bread might say that a serving size is 1 slice and that there are 90 calories in a serving. If you eat 1 slice, you will have eaten 90 calories. If you eat 2 slices, you will have eaten 180 calories.   How do I keep a food log? After each time that you eat, record the following in your food log as soon as possible:  What you ate. Be sure to include toppings, sauces, and other extras on the food.  How much you ate. This can be measured in cups, ounces, or number of items.  How many calories were in each food and drink.  The total number of calories in the food you ate. Keep your food log near you, such as in a pocket-sized notebook or on an app or website on your mobile phone. Some programs will calculate calories for you and show you how many calories you have left to meet your daily goal. What are some portion-control tips?  Know how many calories are in a serving. This will help you know how many servings you can have of a certain food.  Use a measuring cup to measure serving sizes. You could also try weighing out portions  on a kitchen scale. With time, you will be able to estimate serving sizes for some foods.  Take time to put servings of different foods  on your favorite plates or in your favorite bowls and cups so you know what a serving looks like.  Try not to eat straight from a food's packaging, such as from a bag or box. Eating straight from the package makes it hard to see how much you are eating and can lead to overeating. Put the amount you would like to eat in a cup or on a plate to make sure you are eating the right portion.  Use smaller plates, glasses, and bowls for smaller portions and to prevent overeating.  Try not to multitask. For example, avoid watching TV or using your computer while eating. If it is time to eat, sit down at a table and enjoy your food. This will help you recognize when you are full. It will also help you be more mindful of what and how much you are eating. What are tips for following this plan? Reading food labels  Check the calorie count compared with the serving size. The serving size may be smaller than what you are used to eating.  Check the source of the calories. Try to choose foods that are high in protein, fiber, and vitamins, and low in saturated fat, trans fat, and sodium. Shopping  Read nutrition labels while you shop. This will help you make healthy decisions about which foods to buy.  Pay attention to nutrition labels for low-fat or fat-free foods. These foods sometimes have the same number of calories or more calories than the full-fat versions. They also often have added sugar, starch, or salt to make up for flavor that was removed with the fat.  Make a grocery list of lower-calorie foods and stick to it. Cooking  Try to cook your favorite foods in a healthier way. For example, try baking instead of frying.  Use low-fat dairy products. Meal planning  Use more fruits and vegetables. One-half of your plate should be fruits and vegetables.  Include lean proteins, such as chicken, [place], and fish. Lifestyle Each week, aim to do one of the following:  150 minutes of moderate exercise,  such as walking.  75 minutes of vigorous exercise, such as running. General information  Know how many calories are in the foods you eat most often. This will help you calculate calorie counts faster.  Find a way of tracking calories that works for you. Get creative. Try different apps or programs if writing down calories does not work for you. What foods should I eat?  Eat nutritious foods. It is better to have a nutritious, high-calorie food, such as an avocado, than a food with few nutrients, such as a bag of potato chips.  Use your calories on foods and drinks that will fill you up and will not leave you hungry soon after eating. ? Examples of foods that fill you up are nuts and nut butters, vegetables, lean proteins, and high-fiber foods such as whole grains. High-fiber foods are foods with more than 5 g of fiber per serving.  Pay attention to calories in drinks. Low-calorie drinks include water and unsweetened drinks. The items listed above may not be a complete list of foods and beverages you can eat. Contact a dietitian for more information.   What foods should I limit? Limit foods or drinks that are not good sources of vitamins, minerals, or protein or that  are high in unhealthy fats. These include:  Candy.  Other sweets.  Sodas, specialty coffee drinks, alcohol, and juice. The items listed above may not be a complete list of foods and beverages you should avoid. Contact a dietitian for more information. How do I count calories when eating out?  Pay attention to portions. Often, portions are much larger when eating out. Try these tips to keep portions smaller: ? Consider sharing a meal instead of getting your own. ? If you get your own meal, eat only half of it. Before you start eating, ask for a container and put half of your meal into it. ? When available, consider ordering smaller portions from the menu instead of full portions.  Pay attention to your food and drink  choices. Knowing the way food is cooked and what is included with the meal can help you eat fewer calories. ? If calories are listed on the menu, choose the lower-calorie options. ? Choose dishes that include vegetables, fruits, whole grains, low-fat dairy products, and lean proteins. ? Choose items that are boiled, broiled, grilled, or steamed. Avoid items that are buttered, battered, fried, or served with cream sauce. Items labeled as crispy are usually fried, unless stated otherwise. ? Choose water, low-fat milk, unsweetened iced tea, or other drinks without added sugar. If you want an alcoholic beverage, choose a lower-calorie option, such as a glass of wine or light beer. ? Ask for dressings, sauces, and syrups on the side. These are usually high in calories, so you should limit the amount you eat. ? If you want a salad, choose a garden salad and ask for grilled meats. Avoid extra toppings such as bacon, cheese, or fried items. Ask for the dressing on the side, or ask for olive oil and vinegar or lemon to use as dressing.  Estimate how many servings of a food you are given. Knowing serving sizes will help you be aware of how much food you are eating at restaurants. Where to find more information  Centers for Disease Control and Prevention: [employer]  [place] Department of Agriculture: [employer] Summary  Calorie counting means keeping track of how many calories you eat and drink each day. If you eat fewer calories than your body needs, you should lose weight.  A healthy amount of weight to lose per week is usually 1-2 lb (0.5-0.9 kg). This usually means reducing your daily calorie intake by 500-750 calories.  The number of calories in a food can be found on a Nutrition Facts label. If a food does not have a Nutrition Facts label, try to look up the calories online or ask your dietitian for help.  Use smaller plates, glasses, and bowls for smaller portions and to prevent overeating.  Use  your calories on foods and drinks that will fill you up and not leave you hungry shortly after a meal. This information is not intended to replace advice given to you by your health care provider. Make sure you discuss any questions you have with your health care provider. Document Revised: 06/01/2019 Document Reviewed: 06/01/2019 Elsevier Patient Education  2021 [employer].

## 2020-07-24 NOTE — Progress Notes (Signed)
Established Patient Office Visit  Subjective:  Patient ID: Jason Hurst, male    DOB: 04-29-1981  Age: 40 y.o. MRN: [id number]  CC:  Chief Complaint  Patient presents with  . Annual Exam    CPE, no concerns. Patient fasting for labs.     HPI Jason Hurst presents for a physical exam.  He has been doing relatively well.  Continues to abstain from smoking.  Continue his work as of [employer].  Lives with his wife and small children.  Is active at work but has no regular exercise.  Dad and paternal grandmother both have diabetes.  No past medical history on file.  No past surgical history on file.  Family History  Problem Relation Age of Onset  . Healthy Mother   . Diabetes Father   . Heart disease Father     Social History   Socioeconomic History  . Marital status: Married    Spouse name: Not on file  . Number of children: Not on file  . Years of education: Not on file  . Highest education level: Not on file  Occupational History  . Not on file  Tobacco Use  . Smoking status: Former Smoker    Quit date: 07/13/2018    Years since quitting: 2.0  . Smokeless tobacco: Never Used  Vaping Use  . Vaping Use: Never used  Substance and Sexual Activity  . Alcohol use: Yes    Comment: very occasionally.   . Drug use: Yes    Types: Marijuana  . Sexual activity: Yes  Other Topics Concern  . Not on file  Social History Narrative  . Not on file   Social Determinants of Health   Financial Resource Strain: Not on file  Food Insecurity: Not on file  Transportation Needs: Not on file  Physical Activity: Not on file  Stress: Not on file  Social Connections: Not on file  Intimate Partner Violence: Not on file    Outpatient Medications Prior to Visit  Medication Sig Dispense Refill  . benzonatate (TESSALON PERLES) 100 MG capsule Take 1 capsule (100 mg total) by mouth 3 (three) times daily as needed. (Patient not taking: Reported on 07/24/2020) 20 capsule 0  .  meloxicam (MOBIC) 7.5 MG tablet Take one daily with food for 15 days and then as needed. (Patient not taking: Reported on 07/24/2020) 30 tablet 0   No facility-administered medications prior to visit.    No Known Allergies  ROS Review of Systems  Constitutional: Negative.   HENT: Negative.   Eyes: Negative for photophobia and visual disturbance.  Respiratory: Negative.   Cardiovascular: Negative.   Gastrointestinal: Negative.   Endocrine: Negative.   Genitourinary: Negative.   Musculoskeletal: Negative.   Skin: Negative.   Allergic/Immunologic: Negative for immunocompromised state.  Neurological: Negative.   Psychiatric/Behavioral: Negative.       Objective:    Physical Exam Vitals and nursing note reviewed.  Constitutional:      General: He is not in acute distress.    Appearance: Normal appearance. He is not ill-appearing, toxic-appearing or diaphoretic.  HENT:     Head: Normocephalic and atraumatic.     Right Ear: Tympanic membrane, ear canal and external ear normal.     Left Ear: Tympanic membrane, ear canal and external ear normal.     Mouth/Throat:     Mouth: Mucous membranes are moist.     Pharynx: Oropharynx is clear. No oropharyngeal exudate or posterior oropharyngeal erythema.  Eyes:  General: No scleral icterus.    Extraocular Movements: Extraocular movements intact.     Conjunctiva/sclera: Conjunctivae normal.     Pupils: Pupils are equal, round, and reactive to light.  Cardiovascular:     Rate and Rhythm: Normal rate and regular rhythm.  Pulmonary:     Effort: Pulmonary effort is normal.     Breath sounds: Normal breath sounds.  Abdominal:     General: Bowel sounds are normal. There is no distension.     Palpations: Abdomen is soft. There is no mass.     Tenderness: There is no abdominal tenderness. There is no guarding or rebound.     Hernia: No hernia is present.  Musculoskeletal:     Cervical back: No rigidity or tenderness.  Lymphadenopathy:      Cervical: No cervical adenopathy.  Skin:    General: Skin is warm and dry.  Neurological:     Mental Status: He is alert and oriented to person, place, and time.  Psychiatric:        Mood and Affect: Mood normal.        Behavior: Behavior normal.     BP 104/68   Pulse 83   Temp 98.2 F (36.8 C) (Temporal)   Ht 5\' 10"  (1.778 m)   Wt 234 lb 9.6 oz (106.4 kg)   SpO2 96%   BMI 33.66 kg/m  Wt Readings from Last 3 Encounters:  07/24/20 234 lb 9.6 oz (106.4 kg)  07/18/19 235 lb (106.6 kg)  06/14/19 240 lb (108.9 kg)     Health Maintenance Due  Topic Date Due  . Hepatitis C Screening  Never done  . HIV Screening  Never done  . TETANUS/TDAP  Never done    There are no preventive care reminders to display for this patient.  No results found for: TSH Lab Results  Component Value Date   WBC 7.0 03/08/2018   HGB 16.2 03/08/2018   HCT 46.9 03/08/2018   MCV 93.4 03/08/2018   PLT 131.0 (L) 03/08/2018   Lab Results  Component Value Date   NA 138 03/08/2018   K 4.1 03/08/2018   CO2 27 03/08/2018   GLUCOSE 113 (H) 03/08/2018   BUN 9 03/08/2018   CREATININE 0.89 03/08/2018   BILITOT 0.6 03/08/2018   ALKPHOS 69 03/08/2018   AST 14 03/08/2018   ALT 18 03/08/2018   PROT 6.9 03/08/2018   ALBUMIN 4.3 03/08/2018   CALCIUM 9.5 03/08/2018   ANIONGAP 9 02/01/2018   GFR 103.01 03/08/2018   Lab Results  Component Value Date   CHOL 175 03/08/2018   Lab Results  Component Value Date   HDL 42.90 03/08/2018   Lab Results  Component Value Date   LDLCALC 108 (H) 03/08/2018   Lab Results  Component Value Date   TRIG 120.0 03/08/2018   Lab Results  Component Value Date   CHOLHDL 4 03/08/2018   No results found for: HGBA1C    Assessment & Plan:   Problem List Items Addressed This Visit      Other   Elevated glucose   Relevant Orders   Hemoglobin A1c   Healthcare maintenance - Primary   Relevant Orders   CBC   Comprehensive metabolic panel   Lipid panel    Urinalysis, Routine w reflex microscopic      No orders of the defined types were placed in this encounter.   Follow-up: Return in about 1 year (around 07/24/2021), or if symptoms worsen or fail to  improve.  Given information on health maintenance and disease prevention.  Advised to increase exercise and lose some weight.  Given information on calorie counting to lose weight.  Mliss Sax, MD

## 2020-09-03 ENCOUNTER — Other Ambulatory Visit (HOSPITAL_COMMUNITY): Payer: Self-pay

## 2020-09-03 ENCOUNTER — Ambulatory Visit (INDEPENDENT_AMBULATORY_CARE_PROVIDER_SITE_OTHER): Payer: No Typology Code available for payment source | Admitting: Nurse Practitioner

## 2020-09-03 ENCOUNTER — Ambulatory Visit
Admission: RE | Admit: 2020-09-03 | Discharge: 2020-09-03 | Disposition: A | Discharge status: Home / Self Care | Payer: No Typology Code available for payment source | Source: Ambulatory Visit | Attending: Nurse Practitioner | Admitting: Nurse Practitioner

## 2020-09-03 ENCOUNTER — Other Ambulatory Visit: Payer: Self-pay

## 2020-09-03 VITALS — BP 124/86 | HR 83 | Temp 97.9°F | Resp 18

## 2020-09-03 DIAGNOSIS — R059 Cough, unspecified: Secondary | ICD-10-CM | POA: Diagnosis not present

## 2020-09-03 DIAGNOSIS — Z8616 Personal history of COVID-19: Secondary | ICD-10-CM

## 2020-09-03 MED ORDER — ALBUTEROL SULFATE HFA 108 (90 BASE) MCG/ACT IN AERS
2.0000 | INHALATION_SPRAY | Freq: Four times a day (QID) | RESPIRATORY_TRACT | 0 refills | Status: DC | PRN
  Filled 2020-09-03: qty 18, 25d supply, fill #0

## 2020-09-03 MED ORDER — MONTELUKAST SODIUM 10 MG PO TABS
10.0000 mg | ORAL_TABLET | Freq: Every day | ORAL | 3 refills | Status: DC
  Filled 2020-09-03: qty 30, 30d supply, fill #0

## 2020-09-03 MED ORDER — BUDESONIDE-FORMOTEROL FUMARATE 80-4.5 MCG/ACT IN AERO
2.0000 | INHALATION_SPRAY | Freq: Two times a day (BID) | RESPIRATORY_TRACT | 3 refills | Status: DC
  Filled 2020-09-03: qty 10.2, 30d supply, fill #0

## 2020-09-03 MED ORDER — PREDNISONE 20 MG PO TABS
20.0000 mg | ORAL_TABLET | Freq: Every day | ORAL | 0 refills | Status: AC
  Filled 2020-09-03: qty 5, 5d supply, fill #0

## 2020-09-03 NOTE — Patient Instructions (Addendum)
Covid 19 Cough Shortness of breath Fatigue:   Stay well hydrated  Stay active  Deep breathing exercises  May take tylenol for fever or pain  May take mucinex DM twice daily  Will order chest x ray:  [facility] [address] [place], [place] [address] [phone] MON - FRI 8:00 AM - 4:00 PM - WALK IN   Will order Singulair - at bedtime  Will order prednisone - in the mornings with food  Will order Symbicort - 2 puffs twice daily  Will order albuterol - as needed     Follow up:  Follow up in 2 weeks or sooner if needed

## 2020-09-03 NOTE — Progress Notes (Signed)
@Patient  ID: , male    DOB: 1980/07/16, 40 y.o.   MRN: [id number]  Chief Complaint  Patient presents with  . Shortness of Breath    + covid sept 2021 and again Jan 2022    Referring provider: Feb 2022,*  HPI  Patient presents today for post-COVID care clinic visit.  Patient tested positive for COVID in September 2021 and then again in January 2022.  Patient complains of ongoing shortness of breath with exertion, mucus, cough, fatigue.  He is trying to stay active.  He denies any recent significant fever.  Patient was walked in office today and O2 sats did drop to 92% with ambulation.  Patient denies any past history of asthma. Denies f/c/s, n/v/d, hemoptysis, PND, chest pain or edema.      No Known Allergies   There is no immunization history on file for this patient.  No past medical history on file.  Tobacco History: Social History   Tobacco Use  Smoking Status Former Smoker  . Quit date: 07/13/2018  . Years since quitting: 2.1  Smokeless Tobacco Never Used   Counseling given: Yes   Outpatient Encounter Medications as of 09/03/2020  Medication Sig  . albuterol (VENTOLIN HFA) 108 (90 Base) MCG/ACT inhaler Inhale 2 puffs into the lungs every 6 (six) hours as needed for wheezing or shortness of breath.  . budesonide-formoterol (SYMBICORT) 80-4.5 MCG/ACT inhaler Inhale 2 puffs into the lungs 2 (two) times daily.  . montelukast (SINGULAIR) 10 MG tablet Take 1 tablet (10 mg total) by mouth at bedtime.  . predniSONE (DELTASONE) 20 MG tablet Take 1 tablet (20 mg total) by mouth daily with breakfast for 5 days.  . benzonatate (TESSALON PERLES) 100 MG capsule Take 1 capsule (100 mg total) by mouth 3 (three) times daily as needed. (Patient not taking: Reported on 07/24/2020)  . meloxicam (MOBIC) 7.5 MG tablet Take one daily with food for 15 days and then as needed. (Patient not taking: Reported on 07/24/2020)   No facility-administered encounter  medications on file as of 09/03/2020.     Review of Systems  Review of Systems  Constitutional: Positive for fatigue.  HENT: Positive for postnasal drip.   Respiratory: Positive for cough and shortness of breath.   Cardiovascular: Positive for chest pain. Negative for leg swelling.  Gastrointestinal: Negative.   Allergic/Immunologic: Negative.   Neurological: Negative.   Psychiatric/Behavioral: Negative.        Physical Exam  BP 124/86   Pulse 83   Temp 97.9 F (36.6 C)   Resp 18   SpO2 95%   Wt Readings from Last 5 Encounters:  07/24/20 234 lb 9.6 oz (106.4 kg)  07/18/19 235 lb (106.6 kg)  06/14/19 240 lb (108.9 kg)  05/29/19 241 lb 9.6 oz (109.6 kg)  05/30/18 217 lb 2 oz (98.5 kg)     Physical Exam Vitals and nursing note reviewed.  Constitutional:      General: He is not in acute distress.    Appearance: He is well-developed.  Cardiovascular:     Rate and Rhythm: Normal rate and regular rhythm.  Pulmonary:     Effort: Pulmonary effort is normal.     Breath sounds: Normal breath sounds.  Skin:    General: Skin is warm and dry.  Neurological:     Mental Status: He is alert and oriented to person, place, and time.       Imaging: DG Chest 2 View  Result Date: 09/05/2020 CLINICAL DATA:  40 year old male with history of cough and shortness of breath. EXAM: CHEST - 2 VIEW COMPARISON:  Chest x-ray 04/03/2018. FINDINGS: Lung volumes are normal. No consolidative airspace disease. No pleural effusions. No pneumothorax. No pulmonary nodule or mass noted. Pulmonary vasculature and the cardiomediastinal silhouette are within normal limits. IMPRESSION: No radiographic evidence of acute cardiopulmonary disease. Electronically Signed   By: Trudie Reed M.D.   On: 09/05/2020 10:38     Assessment & Plan:   History of COVID-19 Cough Shortness of breath Fatigue:   Stay well hydrated  Stay active  Deep breathing exercises  May take tylenol for fever or  pain  May take mucinex DM twice daily  Will order chest x ray:  [facility] [address] [place], [place] [address] [phone] MON - FRI 8:00 AM - 4:00 PM - WALK IN   Will order Singulair - at bedtime  Will order prednisone - in the mornings with food  Will order Symbicort - 2 puffs twice daily  Will order albuterol - as needed     Follow up:  Follow up in 2 weeks or sooner if needed      Ivonne Andrew, NP 09/06/2020

## 2020-09-06 DIAGNOSIS — R059 Cough, unspecified: Secondary | ICD-10-CM | POA: Insufficient documentation

## 2020-09-06 DIAGNOSIS — Z8616 Personal history of COVID-19: Secondary | ICD-10-CM | POA: Insufficient documentation

## 2020-09-06 NOTE — Assessment & Plan Note (Signed)
Cough Shortness of breath Fatigue:   Stay well hydrated  Stay active  Deep breathing exercises  May take tylenol for fever or pain  May take mucinex DM twice daily  Will order chest x ray:  [facility] [address] [place], [place] [address] [phone] MON - FRI 8:00 AM - 4:00 PM - WALK IN   Will order Singulair - at bedtime  Will order prednisone - in the mornings with food  Will order Symbicort - 2 puffs twice daily  Will order albuterol - as needed     Follow up:  Follow up in 2 weeks or sooner if needed

## 2020-09-19 ENCOUNTER — Ambulatory Visit: Payer: No Typology Code available for payment source

## 2020-11-26 ENCOUNTER — Emergency Department (HOSPITAL_BASED_OUTPATIENT_CLINIC_OR_DEPARTMENT_OTHER): Payer: No Typology Code available for payment source

## 2020-11-26 ENCOUNTER — Encounter (HOSPITAL_BASED_OUTPATIENT_CLINIC_OR_DEPARTMENT_OTHER): Payer: Self-pay | Admitting: *Deleted

## 2020-11-26 ENCOUNTER — Emergency Department (HOSPITAL_BASED_OUTPATIENT_CLINIC_OR_DEPARTMENT_OTHER)
Admission: EM | Admit: 2020-11-26 | Discharge: 2020-11-26 | Disposition: A | Discharge status: Home / Self Care | Payer: No Typology Code available for payment source | Attending: Emergency Medicine | Admitting: Emergency Medicine

## 2020-11-26 ENCOUNTER — Other Ambulatory Visit: Payer: Self-pay

## 2020-11-26 DIAGNOSIS — Z23 Encounter for immunization: Secondary | ICD-10-CM | POA: Diagnosis not present

## 2020-11-26 DIAGNOSIS — Z8616 Personal history of COVID-19: Secondary | ICD-10-CM | POA: Diagnosis not present

## 2020-11-26 DIAGNOSIS — Z87891 Personal history of nicotine dependence: Secondary | ICD-10-CM | POA: Diagnosis not present

## 2020-11-26 DIAGNOSIS — S51811A Laceration without foreign body of right forearm, initial encounter: Secondary | ICD-10-CM | POA: Diagnosis not present

## 2020-11-26 DIAGNOSIS — S59911A Unspecified injury of right forearm, initial encounter: Secondary | ICD-10-CM | POA: Diagnosis present

## 2020-11-26 DIAGNOSIS — S41111A Laceration without foreign body of right upper arm, initial encounter: Secondary | ICD-10-CM

## 2020-11-26 DIAGNOSIS — W25XXXA Contact with sharp glass, initial encounter: Secondary | ICD-10-CM | POA: Diagnosis not present

## 2020-11-26 DIAGNOSIS — Y990 Civilian activity done for income or pay: Secondary | ICD-10-CM | POA: Diagnosis not present

## 2020-11-26 MED ORDER — LIDOCAINE-EPINEPHRINE (PF) 2 %-1:200000 IJ SOLN
10.0000 mL | Freq: Once | INTRAMUSCULAR | Status: AC
  Administered 2020-11-26: 10 mL via INTRADERMAL
  Filled 2020-11-26: qty 20

## 2020-11-26 MED ORDER — BACITRACIN ZINC 500 UNIT/GM EX OINT
TOPICAL_OINTMENT | Freq: Once | CUTANEOUS | Status: AC
  Administered 2020-11-26: 1 via TOPICAL
  Filled 2020-11-26: qty 28.35

## 2020-11-26 MED ORDER — TETANUS-DIPHTH-ACELL PERTUSSIS 5-2.5-18.5 LF-MCG/0.5 IM SUSY
0.5000 mL | PREFILLED_SYRINGE | Freq: Once | INTRAMUSCULAR | Status: AC
  Administered 2020-11-26: 0.5 mL via INTRAMUSCULAR
  Filled 2020-11-26: qty 0.5

## 2020-11-26 MED ORDER — ACETAMINOPHEN 500 MG PO TABS
1000.0000 mg | ORAL_TABLET | Freq: Once | ORAL | Status: AC
  Administered 2020-11-26: 1000 mg via ORAL
  Filled 2020-11-26: qty 2

## 2020-11-26 NOTE — ED Triage Notes (Signed)
A piece of glass fell and cut his right arm. Pressure dressing applied. Bleeding controlled.

## 2020-11-26 NOTE — ED Provider Notes (Signed)
[facility] EMERGENCY DEPARTMENT Provider Note   CSN: [id number] Arrival date & time: 11/26/20  1200     History Chief Complaint  Patient presents with   Laceration    Jason Hurst is a 40 y.o. male who presents for evaluation of laceration of the right forearm.  He reports that about 10:30 AM this morning, a piece of glass fell down and landed on his arm, causing a laceration.  He is not on blood thinners.  He is not on his last tetanus shot was.  Denies any numbness/weakness.  The history is provided by the patient.      History reviewed. No pertinent past medical history.  Patient Active Problem List   Diagnosis Date Noted   History of COVID-19 09/06/2020   Cough 09/06/2020   Elevated glucose 07/24/2020   Healthcare maintenance 07/24/2020   Lateral epicondylitis of right elbow 05/29/2019   Plantar fasciitis, bilateral 05/29/2019   Tobacco abuse disorder 04/28/2018   On Chantix therapy 04/28/2018    History reviewed. No pertinent surgical history.     Family History  Problem Relation Age of Onset   Healthy Mother    Diabetes Father    Heart disease Father     Social History   Tobacco Use   Smoking status: Former    Types: Cigarettes    Quit date: 07/13/2018    Years since quitting: 2.3   Smokeless tobacco: Never  Vaping Use   Vaping Use: Never used  Substance Use Topics   Alcohol use: Yes    Comment: very occasionally.    Drug use: Yes    Types: Marijuana    Home Medications Prior to Admission medications   Medication Sig Start Date End Date Taking? Authorizing Provider  albuterol (VENTOLIN HFA) 108 (90 Base) MCG/ACT inhaler Inhale 2 puffs into the lungs every 6 (six) hours as needed for wheezing or shortness of breath. 09/03/20   Ivonne Andrew, NP  benzonatate (TESSALON PERLES) 100 MG capsule Take 1 capsule (100 mg total) by mouth 3 (three) times daily as needed. Patient not taking: Reported on 07/24/2020 12/29/19   Junie Spencer,  FNP  budesonide-formoterol (SYMBICORT) 80-4.5 MCG/ACT inhaler Inhale 2 puffs into the lungs 2 (two) times daily. 09/03/20   Ivonne Andrew, NP  meloxicam (MOBIC) 7.5 MG tablet Take one daily with food for 15 days and then as needed. Patient not taking: Reported on 07/24/2020 08/28/19   Mliss Sax, MD  montelukast (SINGULAIR) 10 MG tablet Take 1 tablet (10 mg total) by mouth at bedtime. 09/03/20   Ivonne Andrew, NP    Allergies    Patient has no known allergies.  Review of Systems   Review of Systems  Skin:  Positive for wound.  Neurological:  Negative for weakness and numbness.  All other systems reviewed and are negative.  Physical Exam Updated Vital Signs BP 116/83 (BP Location: Left Arm)   Pulse 67   Temp 97.6 F (36.4 C) (Oral)   Resp 18   Ht 5\' 10"  (1.778 m)   Wt 106.4 kg   SpO2 97%   BMI 33.66 kg/m   Physical Exam Vitals and nursing note reviewed.  Constitutional:      Appearance: He is well-developed.  HENT:     Head: Normocephalic and atraumatic.  Eyes:     General: No scleral icterus.       Right eye: No discharge.        Left eye: No  discharge.     Conjunctiva/sclera: Conjunctivae normal.  Cardiovascular:     Pulses:          Radial pulses are 2+ on the right side and 2+ on the left side.  Pulmonary:     Effort: Pulmonary effort is normal.  Musculoskeletal:     Comments: Full flexion/tension of all 5 digits right upper extremity intact any difficulty.  Skin:    General: Skin is warm and dry.     Comments: 4cm U shaped laceration noted to the anterior right forearm. Good distal cap refill.  RUE is not dusky in appearance or cool to touch.  Neurological:     Mental Status: He is alert.  Psychiatric:        Speech: Speech normal.        Behavior: Behavior normal.     ED Results / Procedures / Treatments   Labs (all labs ordered are listed, but only abnormal results are displayed) Labs Reviewed - No data to  display  EKG None  Radiology DG Forearm Right  Result Date: 11/26/2020 CLINICAL DATA:  Laceration. EXAM: RIGHT FOREARM - 2 VIEW COMPARISON:  No prior. FINDINGS: No acute bony or joint abnormality identified. No evidence of fracture dislocation. Soft tissue laceration noted. Faint radiopaque debris within the laceration cannot be completely excluded. No metallic density foreign body noted. IMPRESSION: 1.  No acute bony abnormality. 2. Soft tissue laceration noted. Faint radiopaque debris in the laceration cannot be completely excluded. No metallic density foreign body noted. Electronically Signed   By: Maisie Fus  Register   On: 11/26/2020 13:51    Procedures .Marland KitchenLaceration Repair  Date/Time: 11/26/2020 3:10 PM Performed by: Maxwell Caul, [name] Authorized by: Maxwell Caul, [name]   Consent:    Consent obtained:  Verbal   Consent given by:  Patient   Risks discussed:  Infection, need for additional repair, pain, poor cosmetic result and poor wound healing   Alternatives discussed:  No treatment and delayed treatment Universal protocol:    Procedure explained and questions answered to patient or proxy's satisfaction: yes     Relevant documents present and verified: yes     Test results available: yes     Imaging studies available: yes     Required blood products, implants, devices, and special equipment available: yes     Site/side marked: yes     Immediately prior to procedure, a time out was called: yes     Patient identity confirmed:  Verbally with patient Anesthesia:    Anesthesia method:  Local infiltration   Local anesthetic:  Lidocaine 2% WITH epi Laceration details:    Location:  Shoulder/arm   Shoulder/arm location:  R lower arm   Length (cm):  4 Pre-procedure details:    Preparation:  Patient was prepped and draped in usual sterile fashion Exploration:    Limited defect created (wound extended): yes     Hemostasis achieved with:  Direct pressure   Wound exploration:  wound explored through full range of motion     Wound extent: no foreign bodies/material noted     Contaminated: no   Treatment:    Area cleansed with:  Povidone-iodine   Amount of cleaning:  Extensive   Irrigation solution:  Sterile saline   Irrigation method:  Syringe   Visualized foreign bodies/material removed: no     Layers/structures repaired:  Deep dermal/superficial fascia Deep dermal/superficial fascia:    Suture size:  5-0   Suture material:  Vicryl  Deep dermal/superficial fascia suture technique: simple interrupted.   Number of sutures:  5 Skin repair:    Repair method:  Sutures   Suture size:  4-0 and 3-0   Suture material:  Prolene   Suture technique:  Simple interrupted   Number of sutures:  12 Approximation:    Approximation:  Close Repair type:    Repair type:  Simple Post-procedure details:    Dressing:  Antibiotic ointment and non-adherent dressing   Procedure completion:  Tolerated Comments:     Once the wound was anesthetized, was thoroughly extensively irrigated with sterile saline.  I did not see any foreign bodies appreciated.  Once the wound started cleaning, there was oozing from a small superficial vessel.  A tourniquet was used for approximately 15 minutes as this vessel was tied off with dissolvable suture.  Unfortunately, given the nature of the laceration, the skin flap was not big enough to fully and closely approximate the wound.  The underlying subcutaneous layer was fixed with Vicryl sutures which but approximated the wound.  The wound itself was repaired with Prolene and approximated as close as possible but given the nature of some of the skin avulsion areas on the more radial aspect of the wound, this was not completely approximated with the skin.  The area was covered with Mepitel dressing.    Medications Ordered in ED Medications  acetaminophen (TYLENOL) tablet 1,000 mg (1,000 mg Oral Given 11/26/20 1313)  Tdap (BOOSTRIX) injection 0.5 mL (0.5  mLs Intramuscular Given 11/26/20 1314)  lidocaine-EPINEPHrine (XYLOCAINE W/EPI) 2 %-1:200000 (PF) injection 10 mL (10 mLs Intradermal Given by Other 11/26/20 1315)  bacitracin ointment (1 application Topical Given 11/26/20 1534)    ED Course  I have reviewed the triage vital signs and the nursing notes.  Pertinent labs & imaging results that were available during my care of the patient were reviewed by me and considered in my medical decision making (see chart for details).    MDM Rules/Calculators/A&P                            40 year-old male who presents for evaluation of right forearm laceration that occurred this morning.  Reports he was at work when a piece of glass broke off and landed on his arm.  Does not know when his last tetanus shot was.  He is not on blood thinners.  On initial arrival, he is afebrile, toxic appearing.  Vital signs are stable.  On exam, he has a U shaped laceration that is about a total of 4  cm. Will plan for wound care, tetanus, repair.  X-ray shows no evidence of bony abnormality.  There is soft tissue laceration noted.  There is faint radiopaque debris in the laceration that cannot be completely excluded.  Laceration repaired as document above.  Patient tolerated procedure well.  Reevaluation after tourniquet shows good pulses as well as good cap refill. At this time, patient exhibits no emergent life-threatening condition that require further evaluation in ED. Patient had ample opportunity for questions and discussion. All patient's questions were answered with full understanding. Strict return precautions discussed. Patient expresses understanding and agreement to plan.   Portions of this note were generated with Scientist, clinical (histocompatibility and immunogenetics). Dictation errors may occur despite best attempts at proofreading.  Final Clinical Impression(s) / ED Diagnoses Final diagnoses:  Laceration of right upper extremity, initial encounter    Rx / DC Orders ED Discharge  Orders     None        Rosana HoesLayden, Amisadai Woodford A, [name] 11/26/20 1845    Arby BarrettePfeiffer, Marcy, MD 11/28/20 [phone]

## 2020-11-26 NOTE — Discharge Instructions (Addendum)
Keep the wound clean and dry for the first 24 hours. After that you may gently clean the wound with soap and water. Make sure to pat dry the wound before covering it with any dressing. You can use topical antibiotic ointment and bandage. Ice and elevate for pain relief.  ° °You can take Tylenol or Ibuprofen as directed for pain. You can alternate Tylenol and Ibuprofen every 4 hours for additional pain relief.  ° °Return to the Emergency Department, your primary care doctor, or the [facility] in 7-10 days for suture removal.  ° °Monitor closely for any signs of infection. Return to the Emergency Department for any worsening redness/swelling of the area that begins to spread, drainage from the site, worsening pain, fever or any other worsening or concerning symptoms.  ° ° °

## 2020-12-02 ENCOUNTER — Telehealth: Payer: Self-pay | Admitting: Family Medicine

## 2020-12-02 NOTE — Telephone Encounter (Signed)
Pt's wife(Jessica) called in stating her husband was sob last night taking the trash out. He tested negative with a home covid test. They think it's from him having covid a while ago. I transferred over to Nurse Triage.

## 2020-12-04 NOTE — Telephone Encounter (Signed)
Called to check on patient, per patient he seems to be feeling better. Was seen at urgent care given antibiotics. Patient has an appointment to follow up with Dr. Doreene Burke.

## 2020-12-06 ENCOUNTER — Encounter: Payer: Self-pay | Admitting: Family Medicine

## 2020-12-06 ENCOUNTER — Ambulatory Visit (INDEPENDENT_AMBULATORY_CARE_PROVIDER_SITE_OTHER): Payer: No Typology Code available for payment source | Admitting: Family Medicine

## 2020-12-06 ENCOUNTER — Other Ambulatory Visit: Payer: Self-pay

## 2020-12-06 VITALS — BP 118/76 | HR 89 | Temp 97.3°F | Ht 70.0 in | Wt 230.2 lb

## 2020-12-06 DIAGNOSIS — Z4802 Encounter for removal of sutures: Secondary | ICD-10-CM | POA: Diagnosis not present

## 2020-12-06 NOTE — Progress Notes (Addendum)
Established Patient Office Visit  Subjective:  Patient ID: Jason Hurst, male    DOB: Sep 23, 1980  Age: 40 y.o. MRN: [id number]  CC:  Chief Complaint  Patient presents with   Suture / Staple Removal    Suture removal right forearm 12 placed 10 days ago.     HPI Jason Hurst presents for suture removal for right volar forearm.  Patient works as of [employer] and a piece of glass slid and lacerated his forearm.  He has kept the wound covered.  He was updated on his tetanus.  Wound is done well.  There has been no discharge streaking or fevers.  Patient consents to have his sutures removed. It is time.   No past medical history on file.  No past surgical history on file.  Family History  Problem Relation Age of Onset   Healthy Mother    Diabetes Father    Heart disease Father     Social History   Socioeconomic History   Marital status: Married    Spouse name: Not on file   Number of children: Not on file   Years of education: Not on file   Highest education level: Not on file  Occupational History   Not on file  Tobacco Use   Smoking status: Former    Types: Cigarettes    Quit date: 07/13/2018    Years since quitting: 2.4   Smokeless tobacco: Never  Vaping Use   Vaping Use: Never used  Substance and Sexual Activity   Alcohol use: Yes    Comment: very occasionally.    Drug use: Yes    Types: Marijuana   Sexual activity: Yes  Other Topics Concern   Not on file  Social History Narrative   Not on file   Social Determinants of Health   Financial Resource Strain: Not on file  Food Insecurity: Not on file  Transportation Needs: Not on file  Physical Activity: Not on file  Stress: Not on file  Social Connections: Not on file  Intimate Partner Violence: Not on file    Outpatient Medications Prior to Visit  Medication Sig Dispense Refill   albuterol (VENTOLIN HFA) 108 (90 Base) MCG/ACT inhaler Inhale 2 puffs into the lungs every 6 (six) hours as needed  for wheezing or shortness of breath. 18 g 0   budesonide-formoterol (SYMBICORT) 80-4.5 MCG/ACT inhaler Inhale 2 puffs into the lungs 2 (two) times daily. 10.2 g 3   montelukast (SINGULAIR) 10 MG tablet Take 1 tablet (10 mg total) by mouth at bedtime. (Patient not taking: Reported on 12/06/2020) 30 tablet 3   benzonatate (TESSALON PERLES) 100 MG capsule Take 1 capsule (100 mg total) by mouth 3 (three) times daily as needed. (Patient not taking: Reported on 07/24/2020) 20 capsule 0   meloxicam (MOBIC) 7.5 MG tablet Take one daily with food for 15 days and then as needed. (Patient not taking: Reported on 07/24/2020) 30 tablet 0   No facility-administered medications prior to visit.    No Known Allergies  ROS Review of Systems  Constitutional:  Negative for diaphoresis, fatigue, fever and unexpected weight change.  Respiratory: Negative.    Cardiovascular: Negative.   Skin:  Positive for wound.  Neurological:  Negative for weakness and numbness.  Psychiatric/Behavioral: Negative.       Objective:    Physical Exam Vitals and nursing note reviewed.  Constitutional:      Appearance: Normal appearance.  HENT:     Head: Normocephalic and  atraumatic.  Pulmonary:     Effort: Pulmonary effort is normal.  Musculoskeletal:       Arms:  Skin:    General: Skin is warm and dry.          Comments: Sutures were removed in total.  Wound is described above.  Patient tolerated the procedure well.  Neurological:     Mental Status: He is alert and oriented to person, place, and time.    BP 118/76 (BP Location: Left Arm, Patient Position: Sitting, Cuff Size: Normal)   Pulse 89   Temp (!) 97.3 F (36.3 C) (Temporal)   Ht 5\' 10"  (1.778 m)   Wt 230 lb 3.2 oz (104.4 kg)   SpO2 96%   BMI 33.03 kg/m  Wt Readings from Last 3 Encounters:  12/06/20 230 lb 3.2 oz (104.4 kg)  11/26/20 234 lb 9.1 oz (106.4 kg)  07/24/20 234 lb 9.6 oz (106.4 kg)     Health Maintenance Due  Topic Date Due    Pneumococcal Vaccine 50-62 Years old (1 - PCV) Never done   HIV Screening  Never done   Hepatitis C Screening  Never done   INFLUENZA VACCINE  12/02/2020    There are no preventive care reminders to display for this patient.  No results found for: TSH Lab Results  Component Value Date   WBC 7.6 07/24/2020   HGB 15.2 07/24/2020   HCT 44.7 07/24/2020   MCV 91.4 07/24/2020   PLT 157.0 07/24/2020   Lab Results  Component Value Date   NA 137 07/24/2020   K 4.1 07/24/2020   CO2 28 07/24/2020   GLUCOSE 108 (H) 07/24/2020   BUN 13 07/24/2020   CREATININE 0.81 07/24/2020   BILITOT 0.6 07/24/2020   ALKPHOS 80 07/24/2020   AST 14 07/24/2020   ALT 20 07/24/2020   PROT 7.2 07/24/2020   ALBUMIN 4.5 07/24/2020   CALCIUM 9.5 07/24/2020   ANIONGAP 9 02/01/2018   GFR 112.23 07/24/2020   Lab Results  Component Value Date   CHOL 189 07/24/2020   Lab Results  Component Value Date   HDL 45.10 07/24/2020   Lab Results  Component Value Date   LDLCALC 112 (H) 07/24/2020   Lab Results  Component Value Date   TRIG 157.0 (H) 07/24/2020   Lab Results  Component Value Date   CHOLHDL 4 07/24/2020   Lab Results  Component Value Date   HGBA1C 5.9 07/24/2020      Assessment & Plan:   Problem List Items Addressed This Visit       Other   Encounter for removal of sutures - Primary    No orders of the defined types were placed in this encounter.   Follow-up: Return if symptoms worsen or fail to improve.  Patient will allow the wound to dry out this weekend.  Discussed signs and symptoms of infection.  He will return accordingly otherwise follow-up as needed.  07/26/2020, MD

## 2021-02-19 ENCOUNTER — Other Ambulatory Visit: Payer: Self-pay

## 2021-02-19 ENCOUNTER — Other Ambulatory Visit (HOSPITAL_COMMUNITY): Payer: Self-pay

## 2021-02-19 ENCOUNTER — Ambulatory Visit (INDEPENDENT_AMBULATORY_CARE_PROVIDER_SITE_OTHER): Payer: No Typology Code available for payment source

## 2021-02-19 ENCOUNTER — Ambulatory Visit
Admission: EM | Admit: 2021-02-19 | Discharge: 2021-02-19 | Disposition: A | Discharge status: Home / Self Care | Payer: No Typology Code available for payment source | Attending: Physician Assistant | Admitting: Physician Assistant

## 2021-02-19 DIAGNOSIS — R0602 Shortness of breath: Secondary | ICD-10-CM

## 2021-02-19 DIAGNOSIS — R059 Cough, unspecified: Secondary | ICD-10-CM | POA: Diagnosis not present

## 2021-02-19 DIAGNOSIS — J209 Acute bronchitis, unspecified: Secondary | ICD-10-CM | POA: Diagnosis not present

## 2021-02-19 DIAGNOSIS — J019 Acute sinusitis, unspecified: Secondary | ICD-10-CM | POA: Diagnosis not present

## 2021-02-19 MED ORDER — PREDNISONE 20 MG PO TABS
40.0000 mg | ORAL_TABLET | Freq: Every day | ORAL | 0 refills | Status: AC
  Filled 2021-02-19 (×2): qty 10, 5d supply, fill #0

## 2021-02-19 MED ORDER — AMOXICILLIN-POT CLAVULANATE 875-125 MG PO TABS
1.0000 | ORAL_TABLET | Freq: Two times a day (BID) | ORAL | 0 refills | Status: DC
  Filled 2021-02-19 (×2): qty 14, 7d supply, fill #0

## 2021-02-19 NOTE — ED Triage Notes (Signed)
Pt c/o SOB on exertion x3wks. States has had cough and congestion x3wks.

## 2021-02-19 NOTE — Discharge Instructions (Addendum)
Take medications as prescribed. Follow up with any further concerns.

## 2021-02-19 NOTE — ED Provider Notes (Signed)
[facility]    CSN: [id number] Arrival date & time: 02/19/21  0825      History   Chief Complaint Chief Complaint  Patient presents with   Shortness of Breath    HPI Jason Hurst is a 40 y.o. male.   Patient here today for evaluation of cough and congestion he has had for the last 3 weeks. He reports some shortness of breath with exertion as well. He  has tried using his inhalers without resolution of symptoms.  Patient reports he has never had diagnosis of asthma that he is aware of.  He denies any fever or chills.  He states he does have significant sinus pressure.  He denies any nausea, vomiting or persistent diarrhea.  The history is provided by the patient.   History reviewed. No pertinent past medical history.  Patient Active Problem List   Diagnosis Date Noted   Encounter for removal of sutures 12/06/2020   History of COVID-19 09/06/2020   Cough 09/06/2020   Elevated glucose 07/24/2020   Healthcare maintenance 07/24/2020   Lateral epicondylitis of right elbow 05/29/2019   Plantar fasciitis, bilateral 05/29/2019   Tobacco abuse disorder 04/28/2018   On Chantix therapy 04/28/2018    History reviewed. No pertinent surgical history.     Home Medications    Prior to Admission medications   Medication Sig Start Date End Date Taking? Authorizing Provider  amoxicillin-clavulanate (AUGMENTIN) 875-125 MG tablet Take 1 tablet by mouth every 12 (twelve) hours. 02/19/21  Yes Tomi Bamberger, [name]  predniSONE (DELTASONE) 20 MG tablet Take 2 tablets (40 mg total) by mouth daily with breakfast for 5 days. 02/19/21 02/24/21 Yes Tomi Bamberger, [name]  albuterol (VENTOLIN HFA) 108 (90 Base) MCG/ACT inhaler Inhale 2 puffs into the lungs every 6 (six) hours as needed for wheezing or shortness of breath. 09/03/20   Ivonne Andrew, NP  budesonide-formoterol (SYMBICORT) 80-4.5 MCG/ACT inhaler Inhale 2 puffs into the lungs 2 (two) times daily. 09/03/20   Ivonne Andrew, NP  montelukast (SINGULAIR) 10 MG tablet Take 1 tablet (10 mg total) by mouth at bedtime. Patient not taking: Reported on 12/06/2020 09/03/20   Ivonne Andrew, NP    Family History Family History  Problem Relation Age of Onset   Healthy Mother    Diabetes Father    Heart disease Father     Social History Social History   Tobacco Use   Smoking status: Former    Types: Cigarettes    Quit date: 07/13/2018    Years since quitting: 2.6   Smokeless tobacco: Never  Vaping Use   Vaping Use: Never used  Substance Use Topics   Alcohol use: Yes    Comment: very occasionally.    Drug use: Yes    Types: Marijuana     Allergies   Patient has no known allergies.   Review of Systems Review of Systems  Constitutional:  Negative for chills and fever.  HENT:  Positive for congestion. Negative for ear pain and sore throat.   Eyes:  Negative for discharge and redness.  Respiratory:  Positive for cough, shortness of breath and wheezing.   Gastrointestinal:  Negative for diarrhea, nausea and vomiting.    Physical Exam Triage Vital Signs ED Triage Vitals  Enc Vitals Group     BP      Pulse      Resp      Temp      Temp src  SpO2      Weight      Height      Head Circumference      Peak Flow      Pain Score      Pain Loc      Pain Edu?      Excl. in GC?    No data found.  Updated Vital Signs BP (!) 135/91 (BP Location: Left Arm)   Pulse 86   Temp (!) 97.5 F (36.4 C) (Oral)   Resp 18   SpO2 91%      Physical Exam Vitals and nursing note reviewed.  Constitutional:      General: He is not in acute distress.    Appearance: Normal appearance. He is not ill-appearing.  HENT:     Head: Normocephalic and atraumatic.     Right Ear: Tympanic membrane normal.     Left Ear: Tympanic membrane normal.     Nose: Nose normal. No congestion.     Mouth/Throat:     Mouth: Mucous membranes are moist.     Pharynx: Oropharynx is clear. No oropharyngeal exudate or  posterior oropharyngeal erythema.  Eyes:     Conjunctiva/sclera: Conjunctivae normal.  Cardiovascular:     Rate and Rhythm: Normal rate and regular rhythm.     Heart sounds: Normal heart sounds. No murmur heard. Pulmonary:     Effort: Pulmonary effort is normal. No respiratory distress.     Breath sounds: Wheezing (diffuse, mild) present. No rhonchi or rales.  Skin:    General: Skin is warm and dry.  Neurological:     Mental Status: He is alert.  Psychiatric:        Mood and Affect: Mood normal.        Thought Content: Thought content normal.     UC Treatments / Results  Labs (all labs ordered are listed, but only abnormal results are displayed) Labs Reviewed - No data to display  EKG   Radiology DG Chest 2 View  Result Date: 02/19/2021 CLINICAL DATA:  40 year old male with shortness of breath for 3 weeks. Cough and congestion. Former smoker. EXAM: CHEST - 2 VIEW COMPARISON:  Chest radiographs 09/03/2020 and earlier. FINDINGS: Lung volumes are at the upper limits of normal, increased since 2019. Mild diffuse increased pulmonary interstitial markings appear stable since that time, likely smoking related. Normal cardiac size and mediastinal contours. Visualized tracheal air column is within normal limits. No pneumothorax, pleural effusion or acute pulmonary opacity. No acute osseous abnormality identified. Negative visible bowel gas. IMPRESSION: No acute cardiopulmonary abnormality. Electronically Signed   By: Odessa Fleming M.D.   On: 02/19/2021 09:59    Procedures Procedures (including critical care time)  Medications Ordered in UC Medications - No data to display  Initial Impression / Assessment and Plan / UC Course  I have reviewed the triage vital signs and the nursing notes.  Pertinent labs & imaging results that were available during my care of the patient were reviewed by me and considered in my medical decision making (see chart for details).   CXR ordered without findings  consistent with pneumonia.  Will treat to cover sinusitis and bronchitis with Augmentin and prednisone burst.  Recommended follow-up if symptoms fail to improve or worsen.  Final Clinical Impressions(s) / UC Diagnoses   Final diagnoses:  Acute sinusitis, recurrence not specified, unspecified location  Acute bronchitis, unspecified organism     Discharge Instructions      Take medications as prescribed. Follow up  with any further concerns.      ED Prescriptions     Medication Sig Dispense Auth. Provider   predniSONE (DELTASONE) 20 MG tablet Take 2 tablets (40 mg total) by mouth daily with breakfast for 5 days. 10 tablet Tomi Bamberger, [name]   amoxicillin-clavulanate (AUGMENTIN) 875-125 MG tablet Take 1 tablet by mouth every 12 (twelve) hours. 14 tablet Tomi Bamberger, [name]      PDMP not reviewed this encounter.   Tomi Bamberger, [name] 02/19/21 1451

## 2021-03-24 ENCOUNTER — Encounter: Payer: Self-pay | Admitting: Family Medicine

## 2021-03-24 ENCOUNTER — Ambulatory Visit (INDEPENDENT_AMBULATORY_CARE_PROVIDER_SITE_OTHER): Payer: No Typology Code available for payment source | Admitting: Family Medicine

## 2021-03-24 ENCOUNTER — Other Ambulatory Visit: Payer: Self-pay

## 2021-03-24 ENCOUNTER — Other Ambulatory Visit (HOSPITAL_COMMUNITY): Payer: Self-pay

## 2021-03-24 ENCOUNTER — Ambulatory Visit (INDEPENDENT_AMBULATORY_CARE_PROVIDER_SITE_OTHER): Payer: No Typology Code available for payment source

## 2021-03-24 VITALS — BP 122/70 | HR 76 | Temp 97.8°F | Ht 70.0 in | Wt 229.8 lb

## 2021-03-24 DIAGNOSIS — J4541 Moderate persistent asthma with (acute) exacerbation: Secondary | ICD-10-CM | POA: Diagnosis not present

## 2021-03-24 MED ORDER — BUDESONIDE-FORMOTEROL FUMARATE 80-4.5 MCG/ACT IN AERO
2.0000 | INHALATION_SPRAY | Freq: Two times a day (BID) | RESPIRATORY_TRACT | 12 refills | Status: DC
  Filled 2021-03-24: qty 10.2, 30d supply, fill #0

## 2021-03-24 MED ORDER — PREDNISONE 20 MG PO TABS
20.0000 mg | ORAL_TABLET | Freq: Two times a day (BID) | ORAL | 0 refills | Status: AC
  Filled 2021-03-24: qty 14, 7d supply, fill #0

## 2021-03-24 NOTE — Progress Notes (Signed)
Established Patient Office Visit  Subjective:  Patient ID: Jason Hurst, male    DOB: 1980/11/25  Age: 40 y.o. MRN: [id number]  CC:  Chief Complaint  Patient presents with   Cough    Little cough, concerns about SOB per patient he seems to feel SOB pretty frequent.     HPI Jason Hurst presents for evaluation of a 2-week history of worrisome wheezing.  Has not been sick recently.  He was exposed to his son's flu but does not feel as though he became sick himself.  He has noticed increasing wheezing and tightness in his chest with dyspnea on exertion.  Denies chest pain or diaphoresis.  Has been using his inhaler up to 4 times daily.  Has not been using the Symbicort he is out of it.  He quit smoking 2 years ago.  There is been no fever or sputum production.  He did not have asthma as a child.  History reviewed. No pertinent past medical history.  History reviewed. No pertinent surgical history.  Family History  Problem Relation Age of Onset   Healthy Mother    Diabetes Father    Heart disease Father     Social History   Socioeconomic History   Marital status: Married    Spouse name: Not on file   Number of children: Not on file   Years of education: Not on file   Highest education level: Not on file  Occupational History   Not on file  Tobacco Use   Smoking status: Former    Types: Cigarettes    Quit date: 07/13/2018    Years since quitting: 2.6   Smokeless tobacco: Never  Vaping Use   Vaping Use: Never used  Substance and Sexual Activity   Alcohol use: Yes    Comment: very occasionally.    Drug use: Yes    Types: Marijuana   Sexual activity: Yes  Other Topics Concern   Not on file  Social History Narrative   Not on file   Social Determinants of Health   Financial Resource Strain: Not on file  Food Insecurity: Not on file  Transportation Needs: Not on file  Physical Activity: Not on file  Stress: Not on file  Social Connections: Not on file   Intimate Partner Violence: Not on file    Outpatient Medications Prior to Visit  Medication Sig Dispense Refill   albuterol (VENTOLIN HFA) 108 (90 Base) MCG/ACT inhaler Inhale 2 puffs into the lungs every 6 (six) hours as needed for wheezing or shortness of breath. 18 g 0   montelukast (SINGULAIR) 10 MG tablet Take 1 tablet (10 mg total) by mouth at bedtime. (Patient not taking: Reported on 12/06/2020) 30 tablet 3   amoxicillin-clavulanate (AUGMENTIN) 875-125 MG tablet Take 1 tablet by mouth every 12 hours. 14 tablet 0   budesonide-formoterol (SYMBICORT) 80-4.5 MCG/ACT inhaler Inhale 2 puffs into the lungs 2 (two) times daily. (Patient not taking: Reported on 03/24/2021) 10.2 g 3   No facility-administered medications prior to visit.    No Known Allergies  ROS Review of Systems  Constitutional:  Negative for diaphoresis, fatigue, fever and unexpected weight change.  HENT: Negative.    Eyes:  Negative for photophobia and visual disturbance.  Respiratory:  Positive for cough, shortness of breath and wheezing. Negative for chest tightness.   Cardiovascular:  Negative for chest pain and palpitations.  Gastrointestinal:  Negative for nausea and vomiting.  Genitourinary: Negative.   Musculoskeletal:  Negative  for gait problem and joint swelling.  Skin:  Negative for color change and pallor.  Neurological:  Negative for speech difficulty and weakness.  Hematological:  Does not bruise/bleed easily.  Psychiatric/Behavioral: Negative.       Objective:    Physical Exam Vitals and nursing note reviewed.  Constitutional:      General: He is not in acute distress.    Appearance: Normal appearance. He is not ill-appearing, toxic-appearing or diaphoretic.  HENT:     Head: Normocephalic and atraumatic.     Right Ear: Tympanic membrane, ear canal and external ear normal.     Left Ear: Tympanic membrane, ear canal and external ear normal.     Mouth/Throat:     Mouth: Mucous membranes are  moist.     Pharynx: Oropharynx is clear. No oropharyngeal exudate or posterior oropharyngeal erythema.  Neck:     Vascular: No carotid bruit.  Cardiovascular:     Rate and Rhythm: Normal rate and regular rhythm.  Pulmonary:     Effort: Pulmonary effort is normal.     Breath sounds: Decreased air movement present. No wheezing, rhonchi or rales.  Musculoskeletal:     Cervical back: No rigidity or tenderness.     Right lower leg: No edema.     Left lower leg: No edema.  Lymphadenopathy:     Cervical: No cervical adenopathy.  Skin:    General: Skin is dry.  Neurological:     Mental Status: He is alert and oriented to person, place, and time.  Psychiatric:        Mood and Affect: Mood normal.        Behavior: Behavior normal.    BP 122/70 (BP Location: Right Arm, Patient Position: Sitting, Cuff Size: Large)   Pulse 76   Temp 97.8 F (36.6 C) (Temporal)   Ht 5\' 10"  (1.778 m)   Wt 229 lb 12.8 oz (104.2 kg)   SpO2 94%   BMI 32.97 kg/m  Wt Readings from Last 3 Encounters:  03/24/21 229 lb 12.8 oz (104.2 kg)  12/06/20 230 lb 3.2 oz (104.4 kg)  11/26/20 234 lb 9.1 oz (106.4 kg)     Health Maintenance Due  Topic Date Due   Pneumococcal Vaccine 86-79 Years old (1 - PCV) Never done   HIV Screening  Never done   Hepatitis C Screening  Never done    There are no preventive care reminders to display for this patient.  No results found for: TSH Lab Results  Component Value Date   WBC 7.6 07/24/2020   HGB 15.2 07/24/2020   HCT 44.7 07/24/2020   MCV 91.4 07/24/2020   PLT 157.0 07/24/2020   Lab Results  Component Value Date   NA 137 07/24/2020   K 4.1 07/24/2020   CO2 28 07/24/2020   GLUCOSE 108 (H) 07/24/2020   BUN 13 07/24/2020   CREATININE 0.81 07/24/2020   BILITOT 0.6 07/24/2020   ALKPHOS 80 07/24/2020   AST 14 07/24/2020   ALT 20 07/24/2020   PROT 7.2 07/24/2020   ALBUMIN 4.5 07/24/2020   CALCIUM 9.5 07/24/2020   ANIONGAP 9 02/01/2018   GFR 112.23 07/24/2020    Lab Results  Component Value Date   CHOL 189 07/24/2020   Lab Results  Component Value Date   HDL 45.10 07/24/2020   Lab Results  Component Value Date   LDLCALC 112 (H) 07/24/2020   Lab Results  Component Value Date   TRIG 157.0 (H) 07/24/2020  Lab Results  Component Value Date   CHOLHDL 4 07/24/2020   Lab Results  Component Value Date   HGBA1C 5.9 07/24/2020      Assessment & Plan:   Problem List Items Addressed This Visit       Respiratory   Moderate persistent asthma with acute exacerbation - Primary   Relevant Medications   budesonide-formoterol (SYMBICORT) 80-4.5 MCG/ACT inhaler   predniSONE (DELTASONE) 20 MG tablet   Other Relevant Orders   DG Chest 2 View   Pulmonary function test    Meds ordered this encounter  Medications   budesonide-formoterol (SYMBICORT) 80-4.5 MCG/ACT inhaler    Sig: Inhale 2 puffs into the lungs in the morning and at bedtime.    Dispense:  1 each    Refill:  12   predniSONE (DELTASONE) 20 MG tablet    Sig: Take 1 tablet (20 mg total) by mouth 2 (two) times daily with a meal for 7 days.    Dispense:  14 tablet    Refill:  0    Follow-up: Return in about 2 months (around 05/24/2021), or if symptoms worsen or fail to improve.  Restart Symbicort.  Send for PFTs and CXR.  Advised that our goal with asthma control for him to use his rescue inhaler no more than twice weekly or not at all.  Suspect adult onset asthma.  Advised flu vaccine. Libby Maw, MD

## 2021-05-11 ENCOUNTER — Telehealth: Payer: No Typology Code available for payment source | Admitting: Emergency Medicine

## 2021-05-11 DIAGNOSIS — J019 Acute sinusitis, unspecified: Secondary | ICD-10-CM

## 2021-05-11 MED ORDER — DOXYCYCLINE HYCLATE 100 MG PO TABS: 100.0000 mg | ORAL_TABLET | Freq: Two times a day (BID) | ORAL | 0 refills | Status: DC

## 2021-05-11 MED ORDER — PREDNISONE 10 MG (21) PO TBPK: ORAL_TABLET | Freq: Every day | ORAL | 0 refills | Status: DC

## 2021-05-11 NOTE — Progress Notes (Signed)
I have spent 5 minutes in review of e-visit questionnaire, review and updating patient chart, medical decision making and response to patient.   Darcel Frane, [name]    

## 2021-05-11 NOTE — Progress Notes (Signed)
E-Visit for Sinus Problems  We are sorry that you are not feeling well.  Here is how we plan to help!  Based on what you have shared with me it looks like you have sinusitis.  Sinusitis is inflammation and infection in the sinus cavities of the head.  Based on your presentation I believe you most likely have Acute Bacterial Sinusitis.  This is an infection caused by bacteria and is treated with antibiotics. I have prescribed Doxycycline 100mg  by mouth twice a day for 10 days. You may use an oral decongestant such as Mucinex D or if you have glaucoma or high blood pressure use plain Mucinex. Saline nasal spray help and can safely be used as often as needed for congestion.  If you develop worsening sinus pain, fever or notice severe headache and vision changes, or if symptoms are not better after completion of antibiotic, please schedule an appointment with a health care provider.    Prednisone prescribed for congestion  Sinus infections are not as easily transmitted as other respiratory infection, however we still recommend that you avoid close contact with loved ones, especially the very young and elderly.  Remember to wash your hands thoroughly throughout the day as this is the number one way to prevent the spread of infection!  Home Care: Only take medications as instructed by your medical team. Complete the entire course of an antibiotic. Do not take these medications with alcohol. A steam or ultrasonic humidifier can help congestion.  You can place a towel over your head and breathe in the steam from hot water coming from a faucet. Avoid close contacts especially the very young and the elderly. Cover your mouth when you cough or sneeze. Always remember to wash your hands.  Get Help Right Away If: You develop worsening fever or sinus pain. You develop a severe head ache or visual changes. Your symptoms persist after you have completed your treatment plan.  Make sure you Understand these  instructions. Will watch your condition. Will get help right away if you are not doing well or get worse.  Thank you for choosing an e-visit.  Your e-visit answers were reviewed by a board certified advanced clinical practitioner to complete your personal care plan. Depending upon the condition, your plan could have included both over the counter or prescription medications.  Please review your pharmacy choice. Make sure the pharmacy is open so you can pick up prescription now. If there is a problem, you may contact your provider through and have the prescription routed to another pharmacy.  Your safety is important to [employer]. If you have drug allergies check your prescription carefully.   For the next 24 hours you can use MyChart to ask questions about today's visit, request a non-urgent call back, or ask for a work or school excuse. You will get an email in the next two days asking about your experience. I hope that your e-visit has been valuable and will speed your recovery.

## 2021-05-23 ENCOUNTER — Telehealth: Payer: Self-pay | Admitting: Family Medicine

## 2021-05-23 NOTE — Addendum Note (Signed)
Addended by: Lynda Rainwater on: 05/23/2021 01:24 PM   Modules accepted: Orders

## 2021-05-23 NOTE — Telephone Encounter (Signed)
Referral for pulmonologist placed, called patient to inform. No answer.

## 2021-05-23 NOTE — Telephone Encounter (Signed)
Patient cancelled appt on the 23rd. Is frustrated because a referral was discussed at his last appt on 11/21 but he has not heard from the lung referral. He would like to know if that is going to happen.

## 2021-05-23 NOTE — Telephone Encounter (Signed)
Returned patients call checking to see if there were current symptoms. It does not look like referral was placed is it okay to put in a referral to pulmonology? Please advise

## 2021-05-26 ENCOUNTER — Ambulatory Visit: Payer: No Typology Code available for payment source | Admitting: Family Medicine

## 2021-05-30 ENCOUNTER — Other Ambulatory Visit: Payer: Self-pay

## 2021-05-30 ENCOUNTER — Encounter: Payer: Self-pay | Admitting: Family Medicine

## 2021-05-30 ENCOUNTER — Ambulatory Visit (INDEPENDENT_AMBULATORY_CARE_PROVIDER_SITE_OTHER): Payer: No Typology Code available for payment source | Admitting: Family Medicine

## 2021-05-30 ENCOUNTER — Other Ambulatory Visit (HOSPITAL_COMMUNITY): Payer: Self-pay

## 2021-05-30 VITALS — BP 134/80 | HR 104 | Temp 97.5°F | Ht 70.0 in | Wt 232.4 lb

## 2021-05-30 DIAGNOSIS — J4541 Moderate persistent asthma with (acute) exacerbation: Secondary | ICD-10-CM

## 2021-05-30 MED ORDER — PREDNISONE 10 MG (21) PO TBPK
ORAL_TABLET | ORAL | 0 refills | Status: DC
Start: 2021-05-30 — End: 2021-09-28
  Filled 2021-05-30: qty 21, 6d supply, fill #0

## 2021-05-30 MED ORDER — ALBUTEROL SULFATE HFA 108 (90 BASE) MCG/ACT IN AERS
1.0000 | INHALATION_SPRAY | Freq: Four times a day (QID) | RESPIRATORY_TRACT | 1 refills | Status: DC | PRN
  Filled 2021-05-30: qty 18, 25d supply, fill #0

## 2021-05-30 MED ORDER — METHYLPREDNISOLONE SODIUM SUCC 125 MG IJ SOLR
125.0000 mg | Freq: Once | INTRAMUSCULAR | Status: AC
  Administered 2021-05-30: 125 mg via INTRAMUSCULAR

## 2021-05-30 MED ORDER — ALBUTEROL SULFATE (2.5 MG/3ML) 0.083% IN NEBU
2.5000 mg | INHALATION_SOLUTION | Freq: Once | RESPIRATORY_TRACT | Status: AC
Start: 2021-05-30 — End: 2021-05-30
  Administered 2021-05-30: 2.5 mg via RESPIRATORY_TRACT

## 2021-05-30 MED ORDER — FLUTICASONE-SALMETEROL 250-50 MCG/ACT IN AEPB
1.0000 | INHALATION_SPRAY | Freq: Two times a day (BID) | RESPIRATORY_TRACT | 1 refills | Status: DC
  Filled 2021-05-30: qty 180, 90d supply, fill #0

## 2021-05-30 NOTE — Progress Notes (Addendum)
Established Patient Office Visit  Subjective:  Patient ID: Jason Hurst, male    DOB: [birth year]  Age: 40 y.o. MRN: [id number]  CC:  Chief Complaint  Patient presents with   Shortness of Breath    Concerns about SOB per patient inhaler not helping that much. States that every time he is active he can not breath.     HPI Jason Hurst presents for evaluation of ongoing wheezing with tightness in the chest over the last few weeks.  He has been using his albuterol more often.  He is taking his Symbicort at night only.  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  He does not smoke.  Denies fevers or recent URI symptoms.  History reviewed. No pertinent past medical history.  History reviewed. No pertinent surgical history.  Family History  Problem Relation Age of Onset   Healthy Mother    Diabetes Father    Heart disease Father     Social History   Socioeconomic History   Marital status: Married    Spouse name: Not on file   Number of children: Not on file   Years of education: Not on file   Highest education level: Not on file  Occupational History   Not on file  Tobacco Use   Smoking status: Former    Types: Cigarettes    Quit date: 07/13/2018    Years since quitting: 2.8   Smokeless tobacco: Never  Vaping Use   Vaping Use: Never used  Substance and Sexual Activity   Alcohol use: Yes    Comment: very occasionally.    Drug use: Yes    Types: Marijuana   Sexual activity: Yes  Other Topics Concern   Not on file  Social History Narrative   Not on file   Social Determinants of Health   Financial Resource Strain: Not on file  Food Insecurity: Not on file  Transportation Needs: Not on file  Physical Activity: Not on file  Stress: Not on file  Social Connections: Not on file  Intimate Partner Violence: Not on file    Outpatient Medications Prior to Visit  Medication Sig Dispense Refill   albuterol (VENTOLIN HFA) 108 (90 Base) MCG/ACT inhaler Inhale 2  puffs into the lungs every 6 (six) hours as needed for wheezing or shortness of breath. 18 g 0   budesonide-formoterol (SYMBICORT) 80-4.5 MCG/ACT inhaler Inhale 2 puffs into the lungs in the morning and at bedtime. 10.2 g 12   montelukast (SINGULAIR) 10 MG tablet Take 1 tablet (10 mg total) by mouth at bedtime. (Patient not taking: Reported on 12/06/2020) 30 tablet 3   doxycycline (VIBRA-TABS) 100 MG tablet Take 1 tablet (100 mg total) by mouth 2 (two) times daily. 20 tablet 0   predniSONE (STERAPRED UNI-PAK 21 TAB) 10 MG (21) TBPK tablet Take by mouth daily. Take 6 tabs by mouth daily  for 2 days, then 5 tabs for 2 days, then 4 tabs for 2 days, then 3 tabs for 2 days, 2 tabs for 2 days, then 1 tab by mouth daily for 2 days 42 tablet 0   No facility-administered medications prior to visit.    No Known Allergies  ROS Review of Systems  Constitutional:  Negative for diaphoresis, fatigue, fever and unexpected weight change.  HENT:  Negative for congestion, postnasal drip and rhinorrhea.   Eyes:  Negative for photophobia and visual disturbance.  Respiratory:  Positive for chest tightness, shortness of breath  and wheezing.   Cardiovascular: Negative.  Negative for chest pain and palpitations.  Gastrointestinal:  Negative for nausea and vomiting.  Neurological:  Negative for speech difficulty and weakness.     Objective:    Physical Exam Vitals and nursing note reviewed.  Constitutional:      General: He is not in acute distress.    Appearance: He is well-developed. He is not ill-appearing, toxic-appearing or diaphoretic.  HENT:     Head: Normocephalic and atraumatic.     Mouth/Throat:     Mouth: Mucous membranes are moist.     Pharynx: No pharyngeal swelling or oropharyngeal exudate.  Neck:     Thyroid: No thyromegaly.     Trachea: No tracheal deviation.  Cardiovascular:     Rate and Rhythm: Normal rate and regular rhythm.  Pulmonary:     Effort: Pulmonary effort is normal. No  tachypnea or accessory muscle usage.     Breath sounds: Examination of the right-middle field reveals wheezing. Examination of the left-middle field reveals wheezing. Examination of the right-lower field reveals wheezing. Examination of the left-lower field reveals wheezing. Wheezing present. No decreased breath sounds, rhonchi or rales.  Lymphadenopathy:     Cervical: No cervical adenopathy.  Skin:    General: Skin is warm and dry.  Neurological:     Mental Status: He is alert and oriented to person, place, and time.  Psychiatric:        Mood and Affect: Mood normal.        Behavior: Behavior normal.    BP 134/80 (BP Location: Left Arm, Patient Position: Sitting, Cuff Size: Large)    Pulse (!) 104    Temp (!) 97.5 F (36.4 C) (Temporal)    Ht 5\' 10"  (1.778 m)    Wt 232 lb 6.4 oz (105.4 kg)    SpO2 93%    BMI 33.35 kg/m  Wt Readings from Last 3 Encounters:  05/30/21 232 lb 6.4 oz (105.4 kg)  03/24/21 229 lb 12.8 oz (104.2 kg)  12/06/20 230 lb 3.2 oz (104.4 kg)     Health Maintenance Due  Topic Date Due   HIV Screening  Never done   Hepatitis C Screening  Never done    There are no preventive care reminders to display for this patient.  No results found for: TSH Lab Results  Component Value Date   WBC 7.6 07/24/2020   HGB 15.2 07/24/2020   HCT 44.7 07/24/2020   MCV 91.4 07/24/2020   PLT 157.0 07/24/2020   Lab Results  Component Value Date   NA 137 07/24/2020   K 4.1 07/24/2020   CO2 28 07/24/2020   GLUCOSE 108 (H) 07/24/2020   BUN 13 07/24/2020   CREATININE 0.81 07/24/2020   BILITOT 0.6 07/24/2020   ALKPHOS 80 07/24/2020   AST 14 07/24/2020   ALT 20 07/24/2020   PROT 7.2 07/24/2020   ALBUMIN 4.5 07/24/2020   CALCIUM 9.5 07/24/2020   ANIONGAP 9 02/01/2018   GFR 112.23 07/24/2020   Lab Results  Component Value Date   CHOL 189 07/24/2020   Lab Results  Component Value Date   HDL 45.10 07/24/2020   Lab Results  Component Value Date   LDLCALC 112 (H)  07/24/2020   Lab Results  Component Value Date   TRIG 157.0 (H) 07/24/2020   Lab Results  Component Value Date   CHOLHDL 4 07/24/2020   Lab Results  Component Value Date   HGBA1C 5.9 07/24/2020  Assessment & Plan:   Problem List Items Addressed This Visit       Respiratory   Moderate persistent asthma with acute exacerbation - Primary   Relevant Medications   albuterol (VENTOLIN HFA) 108 (90 Base) MCG/ACT inhaler   fluticasone-salmeterol (ADVAIR DISKUS) 250-50 MCG/ACT AEPB   predniSONE (STERAPRED UNI-PAK 21 TAB) 10 MG (21) TBPK tablet    Meds ordered this encounter  Medications   albuterol (VENTOLIN HFA) 108 (90 Base) MCG/ACT inhaler    Sig: Inhale 1-2 puffs into the lungs every 6 (six) hours as needed for wheezing or shortness of breath.    Dispense:  18 g    Refill:  1   methylPREDNISolone sodium succinate (SOLU-MEDROL) 125 mg/2 mL injection 125 mg   albuterol (PROVENTIL) (2.5 MG/3ML) 0.083% nebulizer solution 2.5 mg   fluticasone-salmeterol (ADVAIR DISKUS) 250-50 MCG/ACT AEPB    Sig: Inhale 1 puff into the lungs in the morning and at bedtime.    Dispense:  180 each    Refill:  1   predniSONE (STERAPRED UNI-PAK 21 TAB) 10 MG (21) TBPK tablet    Sig: Take 6 tablets today, 5 tablets tomorrow, 4 tablets the next day and then 3, 2, 1 and stop    Dispense:  21 tablet    Refill:  0    Follow-up: Return in about 1 week (around 06/06/2021), or if symptoms worsen or fail to improve.  Will start Advair tonight.  We will start a 6-day Dosepak tomorrow.  He will be sure to take the Advair twice daily.  Libby Maw, MDResponded well to [facility] and solumedrol injection.

## 2021-06-06 ENCOUNTER — Other Ambulatory Visit: Payer: Self-pay

## 2021-06-06 ENCOUNTER — Ambulatory Visit (INDEPENDENT_AMBULATORY_CARE_PROVIDER_SITE_OTHER): Payer: No Typology Code available for payment source | Admitting: Family Medicine

## 2021-06-06 ENCOUNTER — Encounter: Payer: Self-pay | Admitting: Family Medicine

## 2021-06-06 VITALS — BP 122/78 | HR 80 | Temp 96.9°F | Ht 70.0 in | Wt 239.2 lb

## 2021-06-06 DIAGNOSIS — J4520 Mild intermittent asthma, uncomplicated: Secondary | ICD-10-CM | POA: Diagnosis not present

## 2021-06-06 NOTE — Progress Notes (Signed)
Established Patient Office Visit  Subjective:  Patient ID: Jason Hurst, male    DOB: 1981-01-03  Age: 41 y.o. MRN: [id number]  CC:  Chief Complaint  Patient presents with   Follow-up    Follow up on asthma flare up, no concerns.     HPI Jason Hurst presents for follow-up of asthma flare.  Much improved denies dyspnea. No problems with Advair.   No past medical history on file.  No past surgical history on file.  Family History  Problem Relation Age of Onset   Healthy Mother    Diabetes Father    Heart disease Father     Social History   Socioeconomic History   Marital status: Married    Spouse name: Not on file   Number of children: Not on file   Years of education: Not on file   Highest education level: Not on file  Occupational History   Not on file  Tobacco Use   Smoking status: Former    Types: Cigarettes    Quit date: 07/13/2018    Years since quitting: 2.9   Smokeless tobacco: Never  Vaping Use   Vaping Use: Never used  Substance and Sexual Activity   Alcohol use: Yes    Comment: very occasionally.    Drug use: Yes    Types: Marijuana   Sexual activity: Yes  Other Topics Concern   Not on file  Social History Narrative   Not on file   Social Determinants of Health   Financial Resource Strain: Not on file  Food Insecurity: Not on file  Transportation Needs: Not on file  Physical Activity: Not on file  Stress: Not on file  Social Connections: Not on file  Intimate Partner Violence: Not on file    Outpatient Medications Prior to Visit  Medication Sig Dispense Refill   albuterol (VENTOLIN HFA) 108 (90 Base) MCG/ACT inhaler Inhale 1-2 puffs into the lungs every 6 (six) hours as needed for wheezing or shortness of breath. 18 g 1   fluticasone-salmeterol (ADVAIR DISKUS) 250-50 MCG/ACT AEPB Inhale 1 puff into the lungs in the morning and at bedtime. 180 each 1   montelukast (SINGULAIR) 10 MG tablet Take 1 tablet (10 mg total) by mouth at  bedtime. 30 tablet 3   predniSONE (STERAPRED UNI-PAK 21 TAB) 10 MG (21) TBPK tablet Take 6 tablets today, 5 tablets tomorrow, 4 tablets the next day and then 3, 2, 1 and stop (Patient not taking: Reported on 06/06/2021) 21 tablet 0   No facility-administered medications prior to visit.    No Known Allergies  ROS Review of Systems  Constitutional: Negative.   HENT: Negative.    Respiratory:  Negative for cough, chest tightness, shortness of breath and wheezing.   Neurological: Negative.   Psychiatric/Behavioral: Negative.       Objective:    Physical Exam Vitals and nursing note reviewed.  Constitutional:      Appearance: Normal appearance.  HENT:     Head: Normocephalic and atraumatic.     Right Ear: External ear normal.     Left Ear: External ear normal.  Eyes:     General: No scleral icterus.       Right eye: No discharge.        Left eye: No discharge.     Conjunctiva/sclera: Conjunctivae normal.  Cardiovascular:     Rate and Rhythm: Normal rate and regular rhythm.     Pulses: Normal pulses.  Heart sounds: Normal heart sounds.  Pulmonary:     Effort: Pulmonary effort is normal. No respiratory distress.     Breath sounds: Normal breath sounds. No wheezing, rhonchi or rales.  Abdominal:     General: Bowel sounds are normal.  Skin:    General: Skin is warm and dry.  Neurological:     Mental Status: He is alert and oriented to person, place, and time.  Psychiatric:        Mood and Affect: Mood normal.        Behavior: Behavior normal.    BP 122/78 (BP Location: Left Arm, Patient Position: Sitting, Cuff Size: Large)    Pulse 80    Temp (!) 96.9 F (36.1 C) (Temporal)    Ht 5\' 10"  (1.778 m)    Wt 239 lb 3.2 oz (108.5 kg)    SpO2 97%    PF 400 L/min Comment: 1st- 350, 2nd-400, 3rd-350   BMI 34.32 kg/m  Wt Readings from Last 3 Encounters:  06/06/21 239 lb 3.2 oz (108.5 kg)  05/30/21 232 lb 6.4 oz (105.4 kg)  03/24/21 229 lb 12.8 oz (104.2 kg)     Health  Maintenance Due  Topic Date Due   HIV Screening  Never done   Hepatitis C Screening  Never done    There are no preventive care reminders to display for this patient.  No results found for: TSH Lab Results  Component Value Date   WBC 7.6 07/24/2020   HGB 15.2 07/24/2020   HCT 44.7 07/24/2020   MCV 91.4 07/24/2020   PLT 157.0 07/24/2020   Lab Results  Component Value Date   NA 137 07/24/2020   K 4.1 07/24/2020   CO2 28 07/24/2020   GLUCOSE 108 (H) 07/24/2020   BUN 13 07/24/2020   CREATININE 0.81 07/24/2020   BILITOT 0.6 07/24/2020   ALKPHOS 80 07/24/2020   AST 14 07/24/2020   ALT 20 07/24/2020   PROT 7.2 07/24/2020   ALBUMIN 4.5 07/24/2020   CALCIUM 9.5 07/24/2020   ANIONGAP 9 02/01/2018   GFR 112.23 07/24/2020   Lab Results  Component Value Date   CHOL 189 07/24/2020   Lab Results  Component Value Date   HDL 45.10 07/24/2020   Lab Results  Component Value Date   LDLCALC 112 (H) 07/24/2020   Lab Results  Component Value Date   TRIG 157.0 (H) 07/24/2020   Lab Results  Component Value Date   CHOLHDL 4 07/24/2020   Lab Results  Component Value Date   HGBA1C 5.9 07/24/2020      Assessment & Plan:   Problem List Items Addressed This Visit   None Visit Diagnoses     Mild intermittent asthma without complication    -  Primary   Relevant Orders   Peak flow meter       No orders of the defined types were placed in this encounter.   Follow-up: Return in about 1 year (around 06/06/2022), or if symptoms worsen or fail to improve.  Topic flow today was 450.  We will continue Advair as directed.  08/05/2022, MD

## 2021-06-17 ENCOUNTER — Ambulatory Visit (INDEPENDENT_AMBULATORY_CARE_PROVIDER_SITE_OTHER): Payer: No Typology Code available for payment source | Admitting: Pulmonary Disease

## 2021-06-17 ENCOUNTER — Encounter: Payer: Self-pay | Admitting: Pulmonary Disease

## 2021-06-17 ENCOUNTER — Other Ambulatory Visit: Payer: Self-pay

## 2021-06-17 VITALS — BP 110/70 | HR 73 | Temp 97.7°F | Ht 70.0 in | Wt 243.0 lb

## 2021-06-17 DIAGNOSIS — U099 Post covid-19 condition, unspecified: Secondary | ICD-10-CM | POA: Diagnosis not present

## 2021-06-17 DIAGNOSIS — R0609 Other forms of dyspnea: Secondary | ICD-10-CM | POA: Insufficient documentation

## 2021-06-17 NOTE — Progress Notes (Signed)
Subjective:   PATIENT ID: Jason Hurst GENDER: male DOB: [birth year], MRN: [id number]   HPI  Chief Complaint  Patient presents with   Consult    Developed breathing issues since covid 2 yrs ago    Reason for Visit: New consult for asthma  [name] is a 41 year old male former smoker with hx COVID in 2021 who presents as a new consult to pulmonary.  He was recently seen for asthma flareup by his primary physician Dr. Doreene Burke in January 2023.  Note from 05/30/2021 and 06/06/2021 were reviewed.  Had chest tightness and wheezing for few weeks requiring frequent albuterol use.  He was started on Advair 250/50 1 puff twice a day and prednisone pack.  Flareup was resolved.  He reports he was diagnosed with COVID in 2021. He was ill for 2 weeks with fever. Since then he began having more frequent respiratory symptoms every month. 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 He has cough with some phlegm, wheezing and associated shortness of breath and chest congestion. Worsens with exertion including uphill and upstairs. Not usually associated with chest pain but can occur once a month. Activity is limited when ill but ok when he is feeling well. Does not exercise regularly but does yardwork daily. Denies childhood asthma or respiratory infections.  Social History: Started smoking at 18 years. Previously 2ppd x 20 years. Quit at 41 years old, no longer smoker. 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  Past Medical History:  Diagnosis Date   COVID-19 long hauler      Family History  Problem Relation Age of Onset   Healthy Mother    Diabetes Father    Heart disease Father    Healthy Sister    Healthy Brother    Heart attack Maternal Uncle      Social History   Occupational History   Not on file  Tobacco Use   Smoking status: Former     Packs/day: 1.50    Years: 22.00    Pack years: 33.00    Types: Cigarettes    Quit date: 05/05/2018    Years since quitting: 3.1   Smokeless tobacco: Never  Vaping Use   Vaping Use: Never used  Substance and Sexual Activity   Alcohol use: Yes    Comment: very occasionally.    Drug use: Yes    Types: Marijuana   Sexual activity: Yes    No Known Allergies   Outpatient Medications Prior to Visit  Medication Sig Dispense Refill   fluticasone-salmeterol (ADVAIR DISKUS) 250-50 MCG/ACT AEPB Inhale 1 puff into the lungs in the morning and at bedtime. 180 each 1   albuterol (VENTOLIN HFA) 108 (90 Base) MCG/ACT inhaler Inhale 1-2 puffs into the lungs every 6 (six) hours as needed for wheezing or shortness of breath. (Patient not taking: Reported on 06/17/2021) 18 g 1   montelukast (SINGULAIR) 10 MG tablet Take 1 tablet (10 mg total) by mouth at bedtime. (Patient not taking: Reported on 06/17/2021) 30 tablet 3   predniSONE (STERAPRED UNI-PAK 21 TAB) 10 MG (21) TBPK tablet Take 6 tablets today, 5 tablets tomorrow, 4 tablets the next day and then 3, 2, 1 and stop (Patient not taking: Reported on 06/06/2021) 21 tablet 0   No facility-administered medications prior to visit.    Review of Systems  Constitutional:  Negative  for chills, diaphoresis, fever, malaise/fatigue and weight loss.  HENT:  Positive for congestion. Negative for ear pain and sore throat.   Respiratory:  Positive for cough, sputum production, shortness of breath and wheezing. Negative for hemoptysis.   Cardiovascular:  Positive for chest pain. Negative for palpitations and leg swelling.  Gastrointestinal:  Positive for heartburn. Negative for abdominal pain and nausea.  Genitourinary:  Negative for frequency.  Musculoskeletal:  Negative for joint pain and myalgias.  Skin:  Negative for itching and rash.  Neurological:  Negative for dizziness, weakness and headaches.  Endo/Heme/Allergies:  Does not bruise/bleed easily.   Psychiatric/Behavioral:  Negative for depression. The patient is not nervous/anxious.     Objective:   Vitals:   06/17/21 1057  BP: 110/70  Pulse: 73  Temp: 97.7 F (36.5 C)  TempSrc: Oral  SpO2: 99%  Weight: 243 lb (110.2 kg)  Height: 5\' 10"  (1.778 m)   SpO2: 99 % O2 Device: None (Room air)  Physical Exam: General: Well-appearing, no acute distress HENT: [place], AT Eyes: EOMI, no scleral icterus Respiratory: Clear to auscultation bilaterally.  No crackles, wheezing or rales Cardiovascular: RRR, -M/R/G, no JVD Extremities:-Edema,-tenderness Neuro: AAO x4, CNII-XII grossly intact Psych: Normal mood, normal affect  Data Reviewed:  Imaging: CXR 03/24/2021-normal chest x-ray.  No infiltrate, effusion or edema  PFT: None on file  Labs: CBC    Component Value Date/Time   WBC 7.6 07/24/2020 0953   RBC 4.89 07/24/2020 0953   HGB 15.2 07/24/2020 0953   HCT 44.7 07/24/2020 0953   PLT 157.0 07/24/2020 0953   MCV 91.4 07/24/2020 0953   MCH 32.0 02/01/2018 0220   MCHC 34.1 07/24/2020 0953   RDW 13.1 07/24/2020 0953      Assessment & Plan:   Discussion: 41 year old male former smoker with hx COVID in 2021 who presents as a new consult possible asthma. He has had persistent symptoms since COVID-19 in 2021 with recurrent monthly respiratory infections. He was treated for a presumed asthma flare in January 2023 with improvement on steroids. We reviewed clinical course of COVID-19 including long-term complications including post-inflammatory lung disease and long hauler symptoms. Will evaluated with PFTs for underlying obstructive or restrictive lung disease. Also obtain CT Chest to evaluate lung parenchyma.  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  Re-evaluate when 41 years old.  Orders Placed This Encounter  Procedures   CT Chest Wo Contrast    243 / no spinal stimulator, body injector, glucose or heart monitor / no needs / bcbs Epic order/ miriam w [facility]lette No to COVID.    Standing Status:   Future    Standing Expiration Date:   06/17/2022    Scheduling Instructions:     Before 07/31/21 ov    Order Specific Question:   Preferred imaging location?    Answer:   [id number]   Pulmonary function test    Standing Status:   Future    Standing Expiration Date:   06/17/2022    Order Specific Question:   Where should this test be performed?    Answer:   [facility]    Order Specific Question:   Full PFT: includes the following: basic spirometry, spirometry pre & post bronchodilator, diffusion capacity (DLCO), lung volumes    Answer:  Full PFT  No orders of the defined types were placed in this encounter.   No follow-ups on file. After PFTs  I have spent a total time of 45-minutes on the day of the appointment reviewing prior documentation, coordinating care and discussing medical diagnosis and plan with the patient/family. Imaging, labs and tests included in this note have been reviewed and interpreted independently by me.  Nakyla Bracco Mechele Collin, MD [facility] 06/17/2021 2:55 PM  Office Number [phone]

## 2021-06-17 NOTE — Patient Instructions (Addendum)
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

## 2021-07-08 ENCOUNTER — Telehealth: Payer: No Typology Code available for payment source | Admitting: Nurse Practitioner

## 2021-07-08 DIAGNOSIS — J029 Acute pharyngitis, unspecified: Secondary | ICD-10-CM

## 2021-07-08 DIAGNOSIS — J40 Bronchitis, not specified as acute or chronic: Secondary | ICD-10-CM | POA: Diagnosis not present

## 2021-07-08 MED ORDER — AZITHROMYCIN 250 MG PO TABS: ORAL_TABLET | ORAL | 0 refills | Status: AC

## 2021-07-08 MED ORDER — ALBUTEROL SULFATE HFA 108 (90 BASE) MCG/ACT IN AERS: 2.0000 | INHALATION_SPRAY | Freq: Four times a day (QID) | RESPIRATORY_TRACT | 0 refills | Status: DC | PRN

## 2021-07-08 NOTE — Progress Notes (Signed)
We are sorry that you are not feeling well.  Here is how we plan to help! ? ?Based on your presentation I believe you most likely have A cough due to bacteria.  When patients have a fever and a productive cough with a change in color or increased sputum production, we are concerned about bacterial bronchitis.  If left untreated it can progress to pneumonia.  If your symptoms do not improve with your treatment plan it is important that you contact your provider.   I have prescribed Azithromyin 250 mg: two tablets now and then one tablet daily for 4 additonal days  ? ?We will also refill your inhaler- please use this as directed  ?  ?In addition you may use A non-prescription cough medication called Mucinex DM: take 2 tablets every 12 hours. ? ? ?From your responses in the eVisit questionnaire you describe inflammation in the upper respiratory tract which is causing a significant cough.  This is commonly called Bronchitis and has four common causes:   ?Allergies ?Viral Infections ?Acid Reflux ?Bacterial Infection ?Allergies, viruses and acid reflux are treated by controlling symptoms or eliminating the cause. An example might be a cough caused by taking certain blood pressure medications. You stop the cough by changing the medication. Another example might be a cough caused by acid reflux. Controlling the reflux helps control the cough. ? ?USE OF BRONCHODILATOR ("RESCUE") INHALERS: ?There is a risk from using your bronchodilator too frequently.  The risk is that over-reliance on a medication which only relaxes the muscles surrounding the breathing tubes can reduce the effectiveness of medications prescribed to reduce swelling and congestion of the tubes themselves.  Although you feel brief relief from the bronchodilator inhaler, your asthma may actually be worsening with the tubes becoming more swollen and filled with mucus.  This can delay other crucial treatments, such as oral steroid medications. If you need to use  a bronchodilator inhaler daily, several times per day, you should discuss this with your provider.  There are probably better treatments that could be used to keep your asthma under control.  ?   ?HOME CARE ?Only take medications as instructed by your medical team. ?Complete the entire course of an antibiotic. ?Drink plenty of fluids and get plenty of rest. ?Avoid close contacts especially the very young and the elderly ?Cover your mouth if you cough or cough into your sleeve. ?Always remember to wash your hands ?A steam or ultrasonic humidifier can help congestion.  ? ?GET HELP RIGHT AWAY IF: ?You develop worsening fever. ?You become short of breath ?You cough up blood. ?Your symptoms persist after you have completed your treatment plan ?MAKE SURE YOU  ?Understand these instructions. ?Will watch your condition. ?Will get help right away if you are not doing well or get worse. ?  ? ?Thank you for choosing an e-visit. ? ?Your e-visit answers were reviewed by a board certified advanced clinical practitioner to complete your personal care plan. Depending upon the condition, your plan could have included both over the counter or prescription medications. ? ?Please review your pharmacy choice. Make sure the pharmacy is open so you can pick up prescription now. If there is a problem, you may contact your provider through [employer] and have the prescription routed to another pharmacy.  Your safety is important to [place]. If you have drug allergies check your prescription carefully.  ? ?For the next 24 hours you can use MyChart to ask questions about today's visit, request a  non-urgent call back, or ask for a work or school excuse. ?You will get an email in the next two days asking about your experience. I hope that your e-visit has been valuable and will speed your recovery.  ? ?I spent approximately 7 minutes reviewing the patient's history, current symptoms and coordinating their plan of care today.   ? ?Meds ordered  this encounter  ?Medications  ? azithromycin (ZITHROMAX) 250 MG tablet  ?  Sig: Take 2 tablets on day 1, then 1 tablet daily on days 2 through 5  ?  Dispense:  6 tablet  ?  Refill:  0  ? albuterol (VENTOLIN HFA) 108 (90 Base) MCG/ACT inhaler  ?  Sig: Inhale 2 puffs into the lungs every 6 (six) hours as needed for wheezing or shortness of breath.  ?  Dispense:  8 g  ?  Refill:  0  ?  ?

## 2021-07-24 ENCOUNTER — Other Ambulatory Visit: Payer: No Typology Code available for payment source

## 2021-07-31 ENCOUNTER — Ambulatory Visit: Payer: No Typology Code available for payment source | Admitting: Pulmonary Disease

## 2021-08-08 ENCOUNTER — Telehealth: Payer: No Typology Code available for payment source | Admitting: Physician Assistant

## 2021-08-08 DIAGNOSIS — J0110 Acute frontal sinusitis, unspecified: Secondary | ICD-10-CM | POA: Diagnosis not present

## 2021-08-08 MED ORDER — AMOXICILLIN-POT CLAVULANATE 875-125 MG PO TABS: 1.0000 | ORAL_TABLET | Freq: Two times a day (BID) | ORAL | 0 refills | Status: AC

## 2021-08-08 NOTE — Progress Notes (Signed)

## 2021-09-11 ENCOUNTER — Inpatient Hospital Stay: Admission: RE | Admit: 2021-09-11 | Payer: No Typology Code available for payment source | Source: Ambulatory Visit

## 2021-09-17 ENCOUNTER — Ambulatory Visit
Admission: RE | Admit: 2021-09-17 | Discharge: 2021-09-17 | Disposition: A | Discharge status: Home / Self Care | Payer: No Typology Code available for payment source | Source: Ambulatory Visit | Attending: Pulmonary Disease | Admitting: Pulmonary Disease

## 2021-09-17 DIAGNOSIS — U099 Post covid-19 condition, unspecified: Secondary | ICD-10-CM

## 2021-09-22 ENCOUNTER — Ambulatory Visit (INDEPENDENT_AMBULATORY_CARE_PROVIDER_SITE_OTHER): Payer: No Typology Code available for payment source | Admitting: Pulmonary Disease

## 2021-09-22 ENCOUNTER — Other Ambulatory Visit (HOSPITAL_COMMUNITY): Payer: Self-pay

## 2021-09-22 ENCOUNTER — Encounter: Payer: Self-pay | Admitting: Pulmonary Disease

## 2021-09-22 VITALS — BP 116/76 | HR 87 | Temp 97.7°F | Ht 70.0 in | Wt 237.0 lb

## 2021-09-22 DIAGNOSIS — U099 Post covid-19 condition, unspecified: Secondary | ICD-10-CM | POA: Diagnosis not present

## 2021-09-22 DIAGNOSIS — J4540 Moderate persistent asthma, uncomplicated: Secondary | ICD-10-CM | POA: Diagnosis not present

## 2021-09-22 DIAGNOSIS — J449 Chronic obstructive pulmonary disease, unspecified: Secondary | ICD-10-CM | POA: Diagnosis not present

## 2021-09-22 DIAGNOSIS — R0609 Other forms of dyspnea: Secondary | ICD-10-CM | POA: Diagnosis not present

## 2021-09-22 DIAGNOSIS — J4489 Other specified chronic obstructive pulmonary disease: Secondary | ICD-10-CM | POA: Insufficient documentation

## 2021-09-22 LAB — PULMONARY FUNCTION TEST
DL/VA % pred: 93 %
DL/VA: 4.36 ml/min/mmHg/L
DLCO cor % pred: 87 %
DLCO cor: 27.27 ml/min/mmHg
DLCO unc % pred: 87 %
DLCO unc: 27.27 ml/min/mmHg
FEF 25-75 Post: 2.64 L/sec
FEF 25-75 Pre: 1.4 L/sec
FEF2575-%Change-Post: 88 %
FEF2575-%Pred-Post: 65 %
FEF2575-%Pred-Pre: 34 %
FEV1-%Change-Post: 22 %
FEV1-%Pred-Post: 69 %
FEV1-%Pred-Pre: 56 %
FEV1-Post: 2.96 L
FEV1-Pre: 2.42 L
FEV1FVC-%Change-Post: 8 %
FEV1FVC-%Pred-Pre: 81 %
FEV6-%Change-Post: 12 %
FEV6-%Pred-Post: 80 %
FEV6-%Pred-Pre: 71 %
FEV6-Post: 4.19 L
FEV6-Pre: 3.72 L
FEV6FVC-%Change-Post: 0 %
FEV6FVC-%Pred-Post: 102 %
FEV6FVC-%Pred-Pre: 102 %
FVC-%Change-Post: 12 %
FVC-%Pred-Post: 79 %
FVC-%Pred-Pre: 70 %
FVC-Post: 4.2 L
FVC-Pre: 3.72 L
Post FEV1/FVC ratio: 71 %
Post FEV6/FVC ratio: 100 %
Pre FEV1/FVC ratio: 65 %
Pre FEV6/FVC Ratio: 100 %
RV % pred: 164 %
RV: 2.97 L
TLC % pred: 101 %
TLC: 7 L

## 2021-09-22 MED ORDER — ALBUTEROL SULFATE HFA 108 (90 BASE) MCG/ACT IN AERS
1.0000 | INHALATION_SPRAY | Freq: Four times a day (QID) | RESPIRATORY_TRACT | 3 refills | Status: DC | PRN
  Filled 2021-09-22: qty 18, 25d supply, fill #0

## 2021-09-22 MED ORDER — FLUTICASONE-SALMETEROL 250-50 MCG/ACT IN AEPB
1.0000 | INHALATION_SPRAY | Freq: Two times a day (BID) | RESPIRATORY_TRACT | 1 refills | Status: DC
  Filled 2021-09-22: qty 180, 90d supply, fill #0
  Filled 2021-12-18: qty 180, 90d supply, fill #1

## 2021-09-22 NOTE — Progress Notes (Unsigned)
Subjective:   PATIENT ID: Jason Hurst GENDER: male DOB: 10/18/1980, MRN: [id number]   HPI  Chief Complaint  Patient presents with   Follow-up    PFT follow-up     Reason for Visit: Follow-up  [name] is a 41 year old male former smoker with hx COVID in 2021 who presents as a new consult to pulmonary.  Initial consult He was recently seen for asthma flareup by his primary physician Dr. Doreene Burke in January 2023.  Note from 05/30/2021 and 06/06/2021 were reviewed.  Had chest tightness and wheezing for few weeks requiring frequent albuterol use.  He was started on Advair 250/50 1 puff twice a day and prednisone pack.  Flareup was resolved.  He reports he was diagnosed with COVID in 2021. He was ill for 2 weeks with fever. Since then he began having more frequent respiratory symptoms every month. 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 He has cough with some phlegm, wheezing and associated shortness of breath and chest congestion. Worsens with exertion including uphill and upstairs. Not usually associated with chest pain but can occur once a month. Activity is limited when ill but ok when he is feeling well. Does not exercise regularly but does yardwork daily. Denies childhood asthma or respiratory infections.  09/22/21 Since our last visit he has been on Advair with improvement in shortness of breath. Cough is occasional. No wheezing. Able to work without issue. 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   Asthma Control Test ACT Total Score  06/17/2021 10:51 AM 13    Social History: Started smoking at 18 years. Previously 2ppd x 20 years. Quit at 41 years old, no longer smoker. 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 Smokes daily 4-5g   Past Medical History:  Diagnosis Date   COVID-19 long hauler      Family History  Problem Relation Age of Onset   Healthy Mother    Diabetes Father    Heart disease Father    Healthy Sister    Healthy  Brother    Heart attack Maternal Uncle      Social History   Occupational History   Not on file  Tobacco Use   Smoking status: Former    Packs/day: 1.50    Years: 22.00    Pack years: 33.00    Types: Cigarettes    Quit date: 05/05/2018    Years since quitting: 3.3   Smokeless tobacco: Never  Vaping Use   Vaping Use: Never used  Substance and Sexual Activity   Alcohol use: Yes    Comment: very occasionally.    Drug use: Yes    Types: Marijuana   Sexual activity: Yes    No Known Allergies   Outpatient Medications Prior to Visit  Medication Sig Dispense Refill   montelukast (SINGULAIR) 10 MG tablet Take 1 tablet (10 mg total) by mouth at bedtime. (Patient not taking: Reported on 06/17/2021) 30 tablet 3   predniSONE (STERAPRED UNI-PAK 21 TAB) 10 MG (21) TBPK tablet Take 6 tablets today, 5 tablets tomorrow, 4 tablets the next day and then 3, 2, 1 and stop (Patient not taking: Reported on 06/06/2021) 21 tablet 0   albuterol (VENTOLIN HFA) 108 (90 Base) MCG/ACT inhaler Inhale 1-2 puffs into the lungs every 6 (six) hours as needed for wheezing or shortness of breath. (Patient not taking: Reported on 06/17/2021) 18 g 1   albuterol (VENTOLIN HFA) 108 (90 Base)  MCG/ACT inhaler Inhale 2 puffs into the lungs every 6 (six) hours as needed for wheezing or shortness of breath. 8 g 0   fluticasone-salmeterol (ADVAIR DISKUS) 250-50 MCG/ACT AEPB Inhale 1 puff into the lungs in the morning and at bedtime. 180 each 1   No facility-administered medications prior to visit.    Review of Systems  Constitutional:  Negative for chills, diaphoresis, fever, malaise/fatigue and weight loss.  HENT:  Negative for congestion.   Respiratory:  Negative for cough, hemoptysis, sputum production, shortness of breath and wheezing.   Cardiovascular:  Negative for chest pain, palpitations and leg swelling.    Objective:   Vitals:   09/22/21 1601  BP: 116/76  Pulse: 87  Temp: 97.7 F (36.5 C)  TempSrc: Oral   SpO2: 94%  Weight: 237 lb (107.5 kg)  Height: 5\' 10"  (1.778 m)   SpO2: 94 % (RA)  Physical Exam: General: Well-appearing, no acute distress HENT: [place], AT Eyes: EOMI, no scleral icterus Respiratory: Clear to auscultation bilaterally.  No crackles, wheezing or rales Cardiovascular: RRR, -M/R/G, no JVD Extremities:-Edema,-tenderness Neuro: AAO x4, CNII-XII grossly intact Psych: Normal mood, normal affect  Data Reviewed:  Imaging: CXR 03/24/2021-normal chest x-ray.  No infiltrate, effusion or edema CT Chest 09/17/21 - Mild centrilobular and paraseptal emphysema  PFT: 09/22/21 FVC 4.20 (79%) FEV1 2.96 (69%) Ratio 65  TLC 101% RV 164% TV/TLC 162% DLCO 87% Interpretation: Moderate obstructive defect with significant bronchodilator response in FVC and FEV1. Air trapping present. Normal gas exchange.   Labs: CBC    Component Value Date/Time   WBC 7.6 07/24/2020 0953   RBC 4.89 07/24/2020 0953   HGB 15.2 07/24/2020 0953   HCT 44.7 07/24/2020 0953   PLT 157.0 07/24/2020 0953   MCV 91.4 07/24/2020 0953   MCH 32.0 02/01/2018 0220   MCHC 34.1 07/24/2020 0953   RDW 13.1 07/24/2020 0953      Assessment & Plan:   Discussion: 41 year old male former smoker with hx COVID in 2021 who presents for follow-up. Reviewed PFTs which are consistent with asthma. Reviewed CT which demonstrates mild emphysema as well. Findings suggest diagnosis of COPD-asthma overlap. Symptoms well-controlled on medium dose ICS/LABA. Discussed clinical course and management of COPD/asthma including bronchodilator regimen and action plan for exacerbation.  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 If you symptoms do not improve in 24-48 hours, please our office for evaluation  and/or prednisone taper.   Health Maintenance Immunization History  Administered Date(s) Administered   Tdap 11/26/2020   CT Lung Screen - not qualified due to age. Re-evaluate when 41 years old.  No orders of the defined types were placed in this encounter.  Meds ordered this encounter  Medications   fluticasone-salmeterol (ADVAIR DISKUS) 250-50 MCG/ACT AEPB    Sig: Inhale 1 puff into the lungs in the morning and at bedtime.    Dispense:  180 each    Refill:  1   albuterol (VENTOLIN HFA) 108 (90 Base) MCG/ACT inhaler    Sig: Inhale 1-2 puffs into the lungs every 6 (six) hours as needed for wheezing or shortness of breath.    Dispense:  8 g    Refill:  3    Return in about 6 months (around 03/25/2022).   I have spent a total  time of 32-minutes on the day of the appointment including chart review, data review, collecting history, coordinating care and discussing medical diagnosis and plan with the patient/family. Past medical history, allergies, medications were reviewed. Pertinent imaging, labs and tests included in this note have been reviewed and interpreted independently by me.  [place], MD [facility] 09/22/2021 4:36 PM  Office Number [phone]

## 2021-09-22 NOTE — Patient Instructions (Signed)
Full PFT performed today. °

## 2021-09-22 NOTE — Patient Instructions (Signed)
  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 If you symptoms do not improve in 24-48 hours, please our office for evaluation and/or prednisone taper.  Follow-up with me in 6 months

## 2021-09-22 NOTE — Progress Notes (Signed)
Full PFT performed today. °

## 2021-09-24 ENCOUNTER — Other Ambulatory Visit (HOSPITAL_COMMUNITY): Payer: Self-pay

## 2021-09-24 ENCOUNTER — Encounter: Payer: Self-pay | Admitting: Pulmonary Disease

## 2021-09-28 ENCOUNTER — Other Ambulatory Visit: Payer: Self-pay | Admitting: Nurse Practitioner

## 2021-09-28 ENCOUNTER — Telehealth: Payer: No Typology Code available for payment source | Admitting: Nurse Practitioner

## 2021-09-28 DIAGNOSIS — J069 Acute upper respiratory infection, unspecified: Secondary | ICD-10-CM

## 2021-09-28 MED ORDER — PREDNISONE 10 MG (21) PO TBPK: ORAL_TABLET | ORAL | 0 refills | Status: DC

## 2021-09-28 MED ORDER — FLUTICASONE PROPIONATE 50 MCG/ACT NA SUSP: 2.0000 | Freq: Every day | NASAL | 6 refills | Status: DC

## 2021-09-28 NOTE — Progress Notes (Signed)
I have spent 5 minutes in review of e-visit questionnaire, review and updating patient chart, medical decision making and response to patient.  ° °Sung Parodi W Arla Boutwell, NP ° °  °

## 2021-09-28 NOTE — Progress Notes (Signed)
We are sorry that you are not feeling well.  Here is how we plan to help!  Based on your presentation I believe you most likely have A cough due to a virus.  This is called viral bronchitis and is best treated by rest, plenty of fluids and control of the cough.  You may use Ibuprofen or Tylenol as directed to help your symptoms of sore throat.     In addition you may use A steroid nasal spray for congestion.   Prednisone 10 mg daily for 6 days (see taper instructions below)  Directions for 6 day taper: Day 1: 2 tablets before breakfast, 1 after both lunch & dinner and 2 at bedtime Day 2: 1 tab before breakfast, 1 after both lunch & dinner and 2 at bedtime Day 3: 1 tab at each meal & 1 at bedtime Day 4: 1 tab at breakfast, 1 at lunch, 1 at bedtime Day 5: 1 tab at breakfast & 1 tab at bedtime Day 6: 1 tab at breakfast  From your responses in the eVisit questionnaire you describe inflammation in the upper respiratory tract which is causing a significant cough.  This is commonly called Bronchitis and has four common causes:   Allergies Viral Infections Acid Reflux Bacterial Infection Allergies, viruses and acid reflux are treated by controlling symptoms or eliminating the cause. An example might be a cough caused by taking certain blood pressure medications. You stop the cough by changing the medication. Another example might be a cough caused by acid reflux. Controlling the reflux helps control the cough.  USE OF BRONCHODILATOR ("RESCUE") INHALERS: There is a risk from using your bronchodilator too frequently.  The risk is that over-reliance on a medication which only relaxes the muscles surrounding the breathing tubes can reduce the effectiveness of medications prescribed to reduce swelling and congestion of the tubes themselves.  Although you feel brief relief from the bronchodilator inhaler, your asthma may actually be worsening with the tubes becoming more swollen and filled with mucus.   This can delay other crucial treatments, such as oral steroid medications. If you need to use a bronchodilator inhaler daily, several times per day, you should discuss this with your provider.  There are probably better treatments that could be used to keep your asthma under control.     HOME CARE Only take medications as instructed by your medical team. Complete the entire course of an antibiotic. Drink plenty of fluids and get plenty of rest. Avoid close contacts especially the very young and the elderly Cover your mouth if you cough or cough into your sleeve. Always remember to wash your hands A steam or ultrasonic humidifier can help congestion.   GET HELP RIGHT AWAY IF: You develop worsening fever. You become short of breath You cough up blood. Your symptoms persist after you have completed your treatment plan MAKE SURE YOU  Understand these instructions. Will watch your condition. Will get help right away if you are not doing well or get worse.    Thank you for choosing an e-visit.  Your e-visit answers were reviewed by a board certified advanced clinical practitioner to complete your personal care plan. Depending upon the condition, your plan could have included both over the counter or prescription medications.  Please review your pharmacy choice. Make sure the pharmacy is open so you can pick up prescription now. If there is a problem, you may contact your provider through [employer] and have the prescription routed to another pharmacy.  Your safety is important to [place]. If you have drug allergies check your prescription carefully.   For the next 24 hours you can use MyChart to ask questions about today's visit, request a non-urgent call back, or ask for a work or school excuse. You will get an email in the next two days asking about your experience. I hope that your e-visit has been valuable and will speed your recovery.

## 2021-12-18 ENCOUNTER — Other Ambulatory Visit (HOSPITAL_COMMUNITY): Payer: Self-pay

## 2021-12-23 ENCOUNTER — Other Ambulatory Visit (HOSPITAL_COMMUNITY): Payer: Self-pay

## 2021-12-26 ENCOUNTER — Telehealth: Payer: No Typology Code available for payment source | Admitting: Family Medicine

## 2021-12-26 DIAGNOSIS — J069 Acute upper respiratory infection, unspecified: Secondary | ICD-10-CM | POA: Diagnosis not present

## 2021-12-26 DIAGNOSIS — J449 Chronic obstructive pulmonary disease, unspecified: Secondary | ICD-10-CM

## 2021-12-26 MED ORDER — BENZONATATE 200 MG PO CAPS: 200.0000 mg | ORAL_CAPSULE | Freq: Two times a day (BID) | ORAL | 0 refills | Status: DC | PRN

## 2021-12-26 MED ORDER — PREDNISONE 10 MG (21) PO TBPK: ORAL_TABLET | ORAL | 0 refills | Status: DC

## 2021-12-26 NOTE — Progress Notes (Signed)
We are sorry that you are not feeling well.  Here is how we plan to help!  Based on your presentation I believe you most likely have A cough due to a virus.  This is called viral bronchitis and is best treated by rest, plenty of fluids and control of the cough.  You may use Ibuprofen or Tylenol as directed to help your symptoms.     In addition you may use A prescription cough medication called Tessalon Perles 100mg . You may take 1-2 capsules every 8 hours as needed for your cough.  Prednisone 10 mg daily for 6 days (see taper instructions below)  From your responses in the eVisit questionnaire you describe inflammation in the upper respiratory tract which is causing a significant cough.  This is commonly called Bronchitis and has four common causes:   Allergies Viral Infections Acid Reflux Bacterial Infection Allergies, viruses and acid reflux are treated by controlling symptoms or eliminating the cause. An example might be a cough caused by taking certain blood pressure medications. You stop the cough by changing the medication. Another example might be a cough caused by acid reflux. Controlling the reflux helps control the cough.  USE OF BRONCHODILATOR ("RESCUE") INHALERS: There is a risk from using your bronchodilator too frequently.  The risk is that over-reliance on a medication which only relaxes the muscles surrounding the breathing tubes can reduce the effectiveness of medications prescribed to reduce swelling and congestion of the tubes themselves.  Although you feel brief relief from the bronchodilator inhaler, your asthma may actually be worsening with the tubes becoming more swollen and filled with mucus.  This can delay other crucial treatments, such as oral steroid medications. If you need to use a bronchodilator inhaler daily, several times per day, you should discuss this with your provider.  There are probably better treatments that could be used to keep your asthma under control.      HOME CARE Only take medications as instructed by your medical team. Complete the entire course of an antibiotic. Drink plenty of fluids and get plenty of rest. Avoid close contacts especially the very young and the elderly Cover your mouth if you cough or cough into your sleeve. Always remember to wash your hands A steam or ultrasonic humidifier can help congestion.   GET HELP RIGHT AWAY IF: You develop worsening fever. You become short of breath You cough up blood. Your symptoms persist after you have completed your treatment plan MAKE SURE YOU  Understand these instructions. Will watch your condition. Will get help right away if you are not doing well or get worse.    Thank you for choosing an e-visit.  Your e-visit answers were reviewed by a board certified advanced clinical practitioner to complete your personal care plan. Depending upon the condition, your plan could have included both over the counter or prescription medications.  Please review your pharmacy choice. Make sure the pharmacy is open so you can pick up prescription now. If there is a problem, you may contact your provider through and have the prescription routed to another pharmacy.  Your safety is important to [employer]. If you have drug allergies check your prescription carefully.   For the next 24 hours you can use MyChart to ask questions about today's visit, request a non-urgent call back, or ask for a work or school excuse. You will get an email in the next two days asking about your experience. I hope that your e-visit has been  valuable and will speed your recovery.   I have provided 5 minutes of non face to face time during this encounter for chart review and documentation.

## 2022-02-05 ENCOUNTER — Ambulatory Visit (INDEPENDENT_AMBULATORY_CARE_PROVIDER_SITE_OTHER): Payer: No Typology Code available for payment source | Admitting: Family Medicine

## 2022-02-05 ENCOUNTER — Encounter: Payer: Self-pay | Admitting: Family Medicine

## 2022-02-05 VITALS — BP 128/94 | HR 84 | Temp 98.3°F | Ht 70.0 in | Wt 236.2 lb

## 2022-02-05 DIAGNOSIS — R030 Elevated blood-pressure reading, without diagnosis of hypertension: Secondary | ICD-10-CM | POA: Diagnosis not present

## 2022-02-05 DIAGNOSIS — R7309 Other abnormal glucose: Secondary | ICD-10-CM | POA: Diagnosis not present

## 2022-02-05 DIAGNOSIS — K219 Gastro-esophageal reflux disease without esophagitis: Secondary | ICD-10-CM | POA: Diagnosis not present

## 2022-02-05 DIAGNOSIS — Z0000 Encounter for general adult medical examination without abnormal findings: Secondary | ICD-10-CM

## 2022-02-05 LAB — CBC
HCT: 43.4 % (ref 39.0–52.0)
Hemoglobin: 14.8 g/dL (ref 13.0–17.0)
MCHC: 34.1 g/dL (ref 30.0–36.0)
MCV: 92.2 fl (ref 78.0–100.0)
Platelets: 144 10*3/uL — ABNORMAL LOW (ref 150.0–400.0)
RBC: 4.71 Mil/uL (ref 4.22–5.81)
RDW: 12.9 % (ref 11.5–15.5)
WBC: 6.5 10*3/uL (ref 4.0–10.5)

## 2022-02-05 LAB — COMPREHENSIVE METABOLIC PANEL
ALT: 21 U/L (ref 0–53)
AST: 15 U/L (ref 0–37)
Albumin: 4.1 g/dL (ref 3.5–5.2)
Alkaline Phosphatase: 69 U/L (ref 39–117)
BUN: 13 mg/dL (ref 6–23)
CO2: 27 mEq/L (ref 19–32)
Calcium: 9 mg/dL (ref 8.4–10.5)
Chloride: 103 mEq/L (ref 96–112)
Creatinine, Ser: 1.06 mg/dL (ref 0.40–1.50)
GFR: 88.38 mL/min (ref >60.00)
Glucose, Bld: 87 mg/dL (ref 70–99)
Potassium: 4.1 mEq/L (ref 3.5–5.1)
Sodium: 137 mEq/L (ref 135–145)
Total Bilirubin: 0.5 mg/dL (ref 0.2–1.2)
Total Protein: 6.5 g/dL (ref 6.0–8.3)

## 2022-02-05 LAB — LIPID PANEL
Cholesterol: 151 mg/dL (ref 0–200)
HDL: 41 mg/dL (ref >39.00)
LDL Cholesterol: 82 mg/dL (ref 0–99)
NonHDL: 109.59
Total CHOL/HDL Ratio: 4
Triglycerides: 140 mg/dL (ref 0.0–149.0)
VLDL: 28 mg/dL (ref 0.0–40.0)

## 2022-02-05 LAB — HEMOGLOBIN A1C: Hgb A1c MFr Bld: 6 % (ref 4.6–6.5)

## 2022-02-05 NOTE — Progress Notes (Signed)
Established Patient Office Visit  Subjective   Patient ID: Jason Hurst, male    DOB: April 12, 1981  Age: 41 y.o. MRN: [id number]  Chief Complaint  Patient presents with   Annual Exam    CPE c/o  indigestion pt is fasting    HPI for yearly physical.  Doing well.  Fasting this morning.  Has been able to lose some weight.  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  Has developed periodic indigestion that has been responsive to Pepcid.  He recognizes certain inciting foods such as tomatoes and spicy foods.  Rare alcohol usage.  Actually no alcohol usage his wife is pregnant.  She is due in February.  Has not smoked for 3 years now.    Review of Systems  Constitutional: Negative.   HENT: Negative.    Eyes:  Negative for blurred vision, discharge and redness.  Respiratory: Negative.    Cardiovascular: Negative.   Gastrointestinal:  Negative for abdominal pain.  Genitourinary: Negative.   Musculoskeletal: Negative.  Negative for myalgias.  Skin:  Negative for rash.  Neurological:  Negative for tingling, loss of consciousness and weakness.  Endo/Heme/Allergies:  Negative for polydipsia.      02/05/2022    8:46 AM 03/24/2021    8:30 AM 12/06/2020    1:24 PM  Depression screen PHQ 2/9  Decreased Interest 0 0 0  Down, Depressed, Hopeless 0 0 0  PHQ - 2 Score 0 0 0       Objective:     BP (!) 128/94 (BP Location: Left Arm, Patient Position: Sitting, Cuff Size: Normal)   Pulse 84   Temp 98.3 F (36.8 C) (Temporal)   Ht 5\' 10"  (1.778 m)   Wt 236 lb 3.2 oz (107.1 kg)   SpO2 92%   BMI 33.89 kg/m  BP Readings from Last 3 Encounters:  02/05/22 (!) 128/94  09/22/21 116/76  06/17/21 110/70   Wt Readings from Last 3 Encounters:  02/05/22 236 lb 3.2 oz (107.1 kg)  09/22/21 237 lb (107.5 kg)  06/17/21 243 lb (110.2 kg)      Physical Exam Vitals and nursing note reviewed.  Constitutional:      General: He is not in acute distress.     Appearance: He is well-developed. He is not diaphoretic.  HENT:     Head: Normocephalic and atraumatic.     Right Ear: External ear normal.     Left Ear: External ear normal.     Nose: Nose normal.     Mouth/Throat:     Pharynx: No oropharyngeal exudate.  Eyes:     General: No scleral icterus.       Right eye: No discharge.        Left eye: No discharge.     Conjunctiva/sclera: Conjunctivae normal.     Pupils: Pupils are equal, round, and reactive to light.  Neck:     Thyroid: No thyromegaly.     Vascular: No JVD.     Trachea: No tracheal deviation.  Cardiovascular:     Rate and Rhythm: Normal rate and regular rhythm.     Heart sounds: Normal heart sounds. No murmur heard.    No friction rub. No gallop.  Pulmonary:     Effort: No respiratory distress.     Breath sounds: Normal breath sounds. No stridor. No wheezing or rales.  Abdominal:     General: Bowel sounds are normal. There is no distension.  Tenderness: There is no abdominal tenderness. There is no guarding or rebound.     Hernia: There is no hernia in the left inguinal area or right inguinal area.  Genitourinary:    Penis: Circumcised. No hypospadias, erythema, tenderness, discharge, swelling or lesions.      Testes:        Right: Mass, tenderness or swelling not present. Right testis is descended.        Left: Mass, tenderness or swelling not present. Left testis is descended.     Epididymis:     Right: Not inflamed or enlarged.     Left: Not inflamed or enlarged.  Musculoskeletal:        General: No tenderness or deformity. Normal range of motion.     Cervical back: Normal range of motion and neck supple.  Lymphadenopathy:     Cervical: No cervical adenopathy.     Lower Body: No right inguinal adenopathy. No left inguinal adenopathy.  Skin:    General: Skin is warm and dry.  Neurological:     Mental Status: He is alert and oriented to person, place, and time.     Deep Tendon Reflexes: Reflexes are normal  and symmetric.  Psychiatric:        Behavior: Behavior normal.        Thought Content: Thought content normal.      No results found for any visits on 02/05/22.    The ASCVD Risk score (Arnett DK, et al., 2019) failed to calculate for the following reasons:   The 2019 ASCVD risk score is only valid for ages 77 to 33    Assessment & Plan:   Problem List Items Addressed This Visit       Digestive   Gastroesophageal reflux disease     Other   Elevated glucose   Relevant Orders   Hemoglobin A1c   Healthcare maintenance - Primary   Relevant Orders   CBC   Comprehensive metabolic panel   Lipid panel   Urinalysis, Routine w reflex microscopic   Elevated BP without diagnosis of hypertension    No follow-ups on file.  Encouraged regular exercise.  Information was given on preventing hypertension.  We will continue to use Pepcid as needed indigestion.  Recommended weight loss and avoidance of inciting foods.  Will be sure to rinse after using his Advair.  Continue to follow hemoglobin A1c with his father's history of diabetes.  Encouraged him to consider both the flu and fall COVID-vaccine.  Good luck with the new baby coming in February.  Mliss Sax, MD

## 2022-02-05 NOTE — Addendum Note (Signed)
Addended by: Lynnea Ferrier on: 02/05/2022 10:28 AM   Modules accepted: Orders

## 2022-03-24 IMAGING — DX DG CHEST 2V
2 series · 2 of 2 positions shown · non-contrast
Comparison: Chest radiographs 09/03/2020 and earlier.

CLINICAL DATA: 40-year-old male with shortness of breath for 3
weeks. Cough and congestion. Former smoker.

EXAM:
CHEST - 2 VIEW

[chest pa]
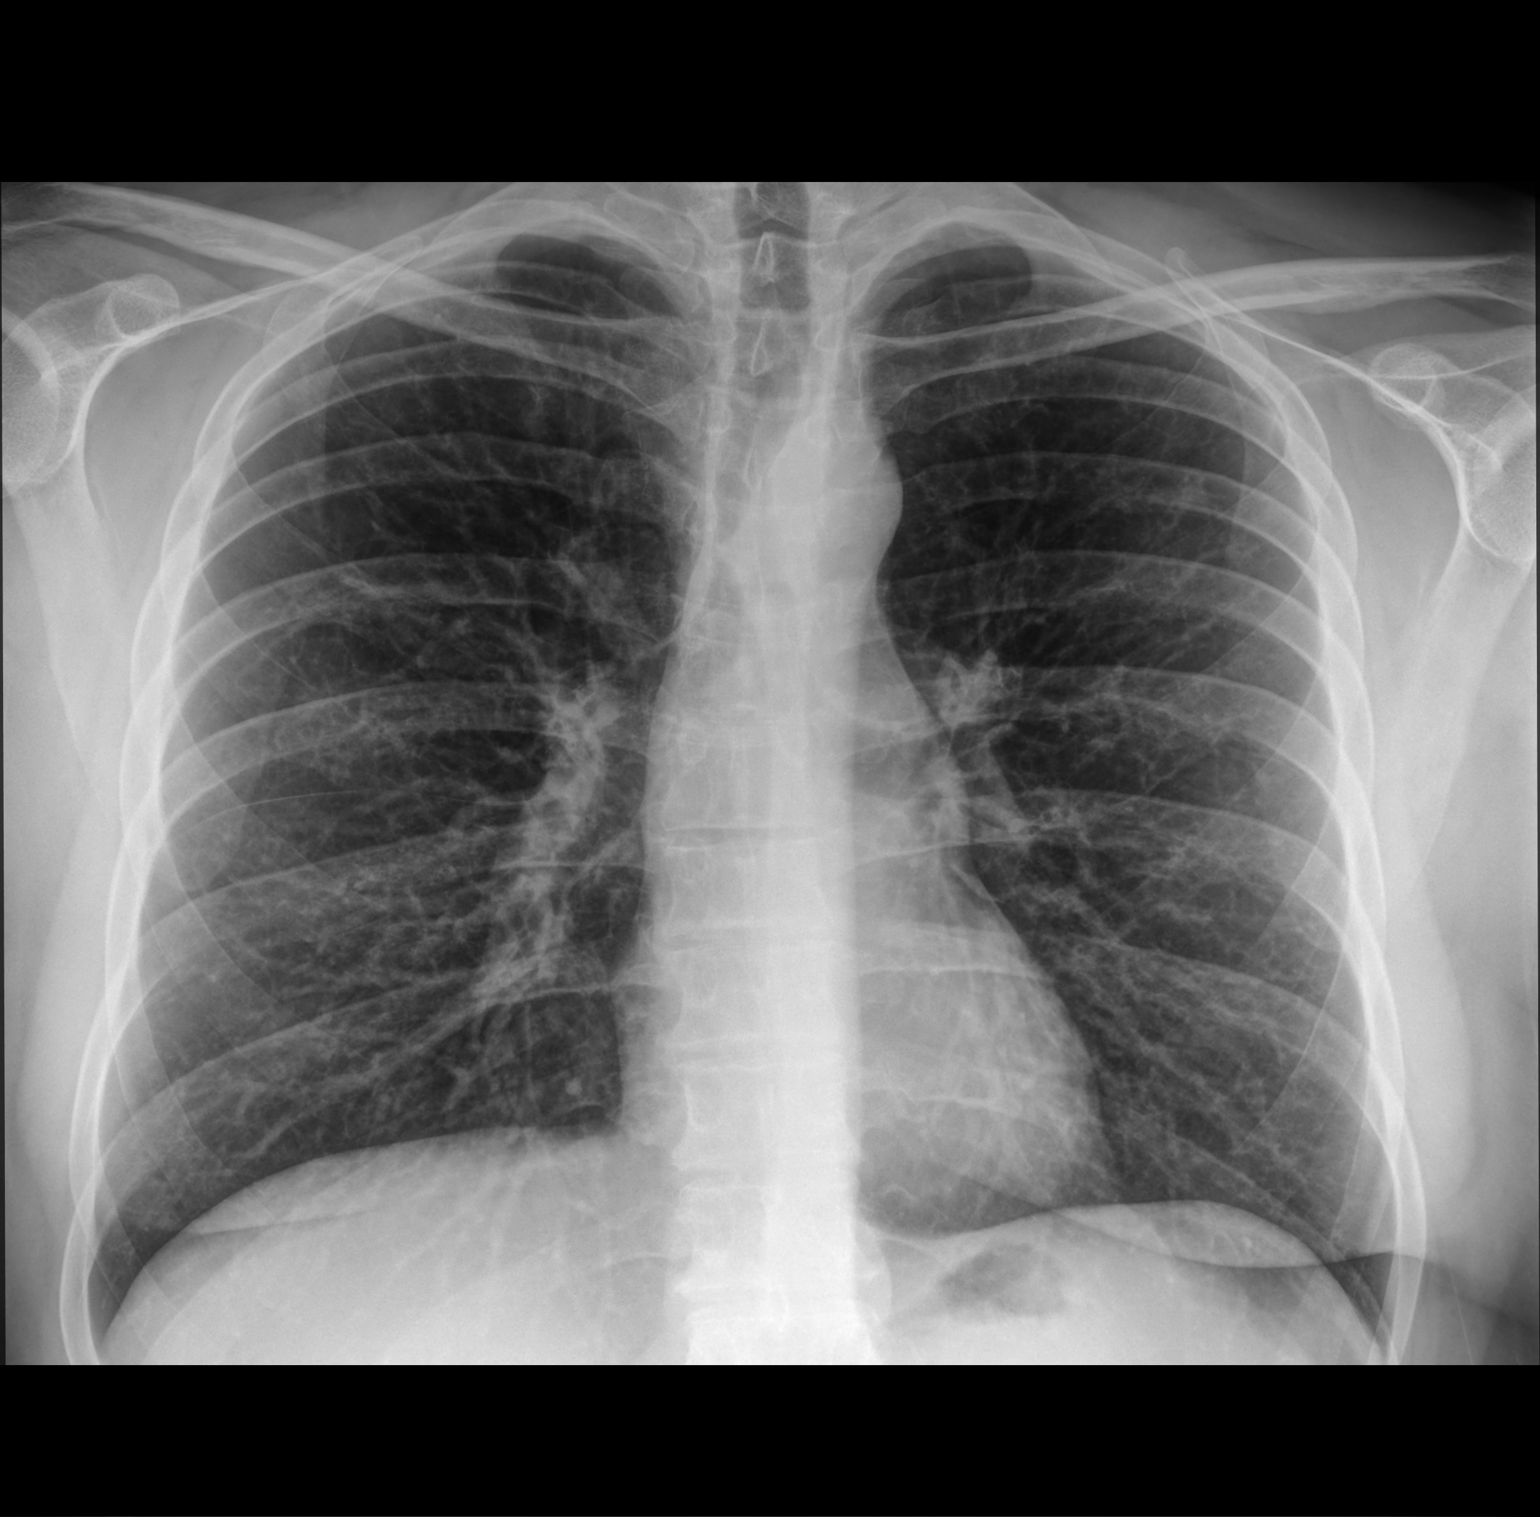

[chest lat]
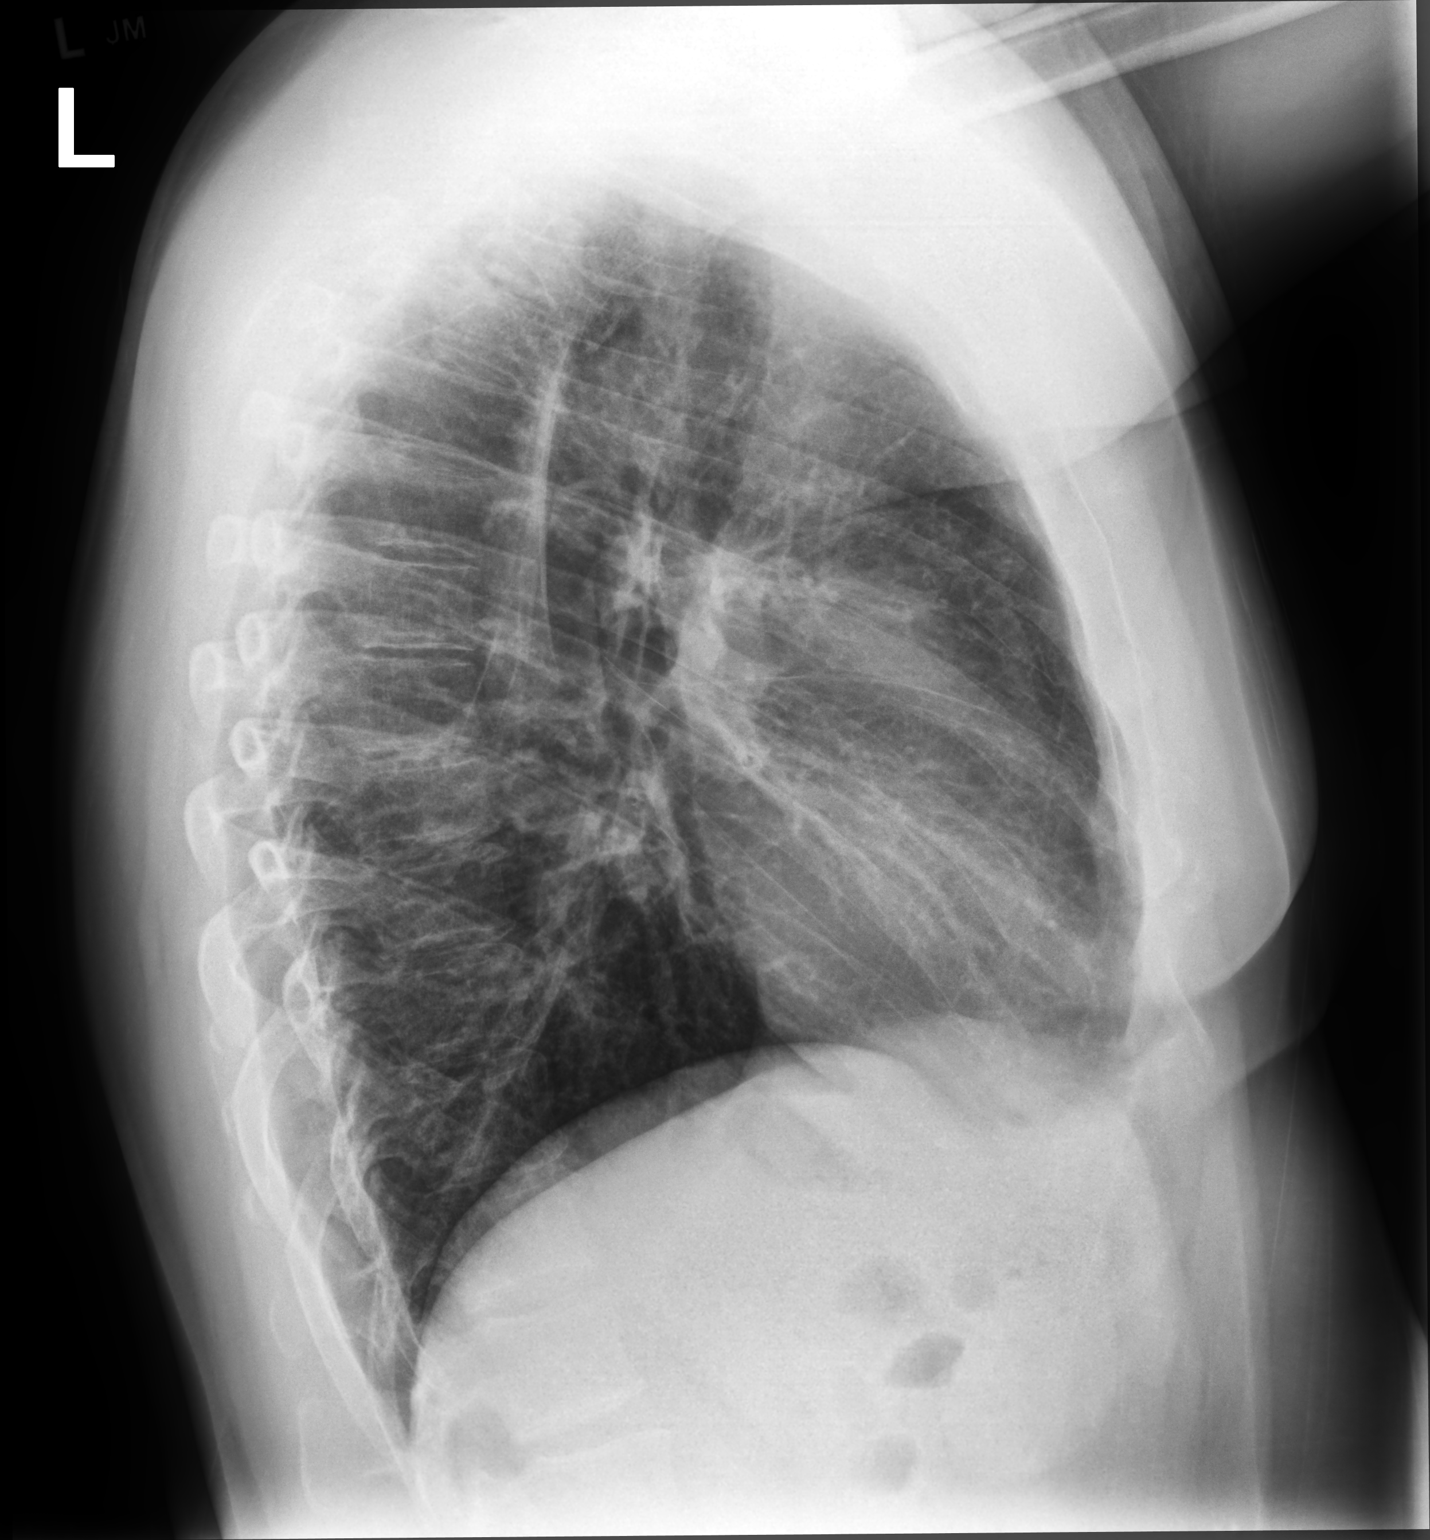

[2 of 2 positions shown; findings below may reference images not displayed]

FINDINGS: Lung volumes are at the upper limits of normal, increased since
4566. Mild diffuse increased pulmonary interstitial markings appear
stable since that time, likely smoking related. Normal cardiac size
and mediastinal contours. Visualized tracheal air column is within
normal limits. No pneumothorax, pleural effusion or acute pulmonary
opacity.

No acute osseous abnormality identified. Negative visible bowel gas.
IMPRESSION: No acute cardiopulmonary abnormality.

## 2022-04-13 ENCOUNTER — Telehealth: Payer: No Typology Code available for payment source | Admitting: Physician Assistant

## 2022-04-13 DIAGNOSIS — J4489 Other specified chronic obstructive pulmonary disease: Secondary | ICD-10-CM | POA: Diagnosis not present

## 2022-04-13 MED ORDER — PREDNISONE 20 MG PO TABS: 40.0000 mg | ORAL_TABLET | Freq: Every day | ORAL | 0 refills | Status: DC

## 2022-04-13 NOTE — Progress Notes (Signed)
E-Visit for Asthma/COPD  Based on what you have shared with me, it looks like you may have a flare up of your asthma.  Asthma is a chronic (ongoing) lung disease which results in airway obstruction, inflammation and hyper-responsiveness.   Asthma symptoms vary from person to person, with common symptoms including nighttime awakening and decreased ability to participate in normal activities as a result of shortness of breath. It is often triggered by changes in weather, changes in the season, changes in air temperature, or inside (home, school, daycare or work) allergens such as animal dander, mold, mildew, woodstoves or cockroaches.   It can also be triggered by hormonal changes, extreme emotion, physical exertion or an upper respiratory tract illness.     It is important to identify the trigger, and then eliminate or avoid the trigger if possible.   If you have been prescribed medications to be taken on a regular basis, it is important to follow the asthma action plan and to follow guidelines to adjust medication in response to increasing symptoms of decreased peak expiratory flow rate  Treatment: I have prescribed: Prednisone 40mg  by mouth per day for 5 - 7 days  HOME CARE Only take medications as instructed by your medical team. Consider wearing a mask or scarf to improve breathing air temperature have been shown to decrease irritation and decrease exacerbations Get rest. Taking a steamy shower or using a humidifier may help nasal congestion sand ease sore throat pain. You can place a towel over your head and breathe in the steam from hot water coming from a faucet. Using a saline nasal spray works much the same way.  Cough drops, hare candies and sore throat lozenges may ease your cough.  Avoid close contacts especially the very you and the elderly Cover your mouth if you cough or sneeze Always remember to wash your hands.    GET HELP RIGHT AWAY IF: You develop worsening symptoms;  breathlessness at rest, drowsy, confused or agitated, unable to speak in full sentences You have coughing fits You develop a severe headache or visual changes You develop shortness of breath, difficulty breathing or start having chest pain Your symptoms persist after you have completed your treatment plan If your symptoms do not improve within 10 days  MAKE SURE YOU Understand these instructions. Will watch your condition. Will get help right away if you are not doing well or get worse.   Your e-visit answers were reviewed by a board certified advanced clinical practitioner to complete your personal care plan, Depending upon the condition, your plan could have included both over the counter or prescription medications.   Please review your pharmacy choice. Your safety is important to . If you have drug allergies check your prescription carefully.  You can use MyChart to ask questions about today's visit, request a non-urgent  call back, or ask for a work or school excuse for 24 hours related to this e-Visit. If it has been greater than 24 hours you will need to follow up with your provider, or enter a new e-Visit to address those concerns.   You will get an e-mail in the next two days asking about your experience. I hope that your e-visit has been valuable and will speed your recovery. Thank you for using e-visits.  I have spent 5 minutes in review of e-visit questionnaire, review and updating patient chart, medical decision making and response to patient.   [place], [name]

## 2022-05-02 ENCOUNTER — Other Ambulatory Visit: Payer: Self-pay | Admitting: Pulmonary Disease

## 2022-05-02 DIAGNOSIS — J4540 Moderate persistent asthma, uncomplicated: Secondary | ICD-10-CM

## 2022-05-05 ENCOUNTER — Other Ambulatory Visit (HOSPITAL_COMMUNITY): Payer: Self-pay

## 2022-05-05 MED ORDER — FLUTICASONE-SALMETEROL 250-50 MCG/ACT IN AEPB
1.0000 | INHALATION_SPRAY | Freq: Two times a day (BID) | RESPIRATORY_TRACT | 1 refills | Status: DC
  Filled 2022-05-05 – 2022-05-19 (×2): qty 180, 90d supply, fill #0
  Filled 2022-10-30: qty 180, 90d supply, fill #1
  Filled 2022-11-14: qty 60, 30d supply, fill #1

## 2022-05-11 ENCOUNTER — Other Ambulatory Visit (HOSPITAL_COMMUNITY): Payer: Self-pay

## 2022-05-11 ENCOUNTER — Encounter (HOSPITAL_COMMUNITY): Payer: Self-pay

## 2022-05-12 ENCOUNTER — Other Ambulatory Visit: Payer: Self-pay

## 2022-05-19 ENCOUNTER — Other Ambulatory Visit (HOSPITAL_COMMUNITY): Payer: Self-pay

## 2022-05-22 ENCOUNTER — Ambulatory Visit: Payer: 59 | Admitting: Family Medicine

## 2022-05-25 ENCOUNTER — Telehealth: Payer: 59 | Admitting: Nurse Practitioner

## 2022-05-25 DIAGNOSIS — J440 Chronic obstructive pulmonary disease with acute lower respiratory infection: Secondary | ICD-10-CM

## 2022-05-25 DIAGNOSIS — J209 Acute bronchitis, unspecified: Secondary | ICD-10-CM | POA: Diagnosis not present

## 2022-05-25 MED ORDER — PREDNISONE 20 MG PO TABS
20.0000 mg | ORAL_TABLET | Freq: Two times a day (BID) | ORAL | 0 refills | Status: AC
  Filled 2022-05-25 – 2022-05-26 (×2): qty 10, 5d supply, fill #0

## 2022-05-25 MED ORDER — AZITHROMYCIN 250 MG PO TABS
ORAL_TABLET | ORAL | 0 refills | Status: AC
  Filled 2022-05-25 – 2022-05-26 (×2): qty 6, 5d supply, fill #0

## 2022-05-25 MED ORDER — BENZONATATE 100 MG PO CAPS
100.0000 mg | ORAL_CAPSULE | Freq: Three times a day (TID) | ORAL | 0 refills | Status: DC | PRN
  Filled 2022-05-25 – 2022-05-26 (×2): qty 30, 10d supply, fill #0

## 2022-05-25 MED ORDER — ALBUTEROL SULFATE HFA 108 (90 BASE) MCG/ACT IN AERS
2.0000 | INHALATION_SPRAY | Freq: Four times a day (QID) | RESPIRATORY_TRACT | 0 refills | Status: AC | PRN
  Filled 2022-05-25 – 2022-05-26 (×2): qty 6.7, 25d supply, fill #0

## 2022-05-25 NOTE — Progress Notes (Signed)
We are sorry that you are not feeling well.  Here is how we plan to help!  Based on your presentation I believe you most likely have A cough due to bacteria complicated by your underlying COPD  Please note that if symptoms recur in the next month we will need to recommend you have a follow up with your primary care since we treated you for an exacerbation last month as well. We always want to be sure if you are having recurrent symptoms that you are getting an in person evaluation when necessary.    I have prescribed Azithromyin 250 mg: two tablets now and then one tablet daily for 4 additonal days and  Prednisone 20 mg twice daily for 5 days    In addition you may use A prescription cough medication called Tessalon Perles 100mg . You may take 1-2 capsules every 8 hours as needed for your cough.   We will also refill your Albuterol to assure you have enough to support you during this illness  Meds ordered this encounter  Medications   predniSONE (DELTASONE) 20 MG tablet    Sig: Take 1 tablet (20 mg total) by mouth 2 (two) times daily with a meal for 5 days.    Dispense:  10 tablet    Refill:  0   azithromycin (ZITHROMAX) 250 MG tablet    Sig: Take 2 tablets on day 1, then 1 tablet daily on days 2 through 5    Dispense:  6 tablet    Refill:  0   albuterol (VENTOLIN HFA) 108 (90 Base) MCG/ACT inhaler    Sig: Inhale 2 puffs into the lungs every 6 (six) hours as needed for wheezing or shortness of breath.    Dispense:  8 g    Refill:  0   benzonatate (TESSALON) 100 MG capsule    Sig: Take 1 capsule (100 mg total) by mouth 3 (three) times daily as needed.    Dispense:  30 capsule    Refill:  0      USE OF BRONCHODILATOR ("RESCUE") INHALERS: There is a risk from using your bronchodilator too frequently.  The risk is that over-reliance on a medication which only relaxes the muscles surrounding the breathing tubes can reduce the effectiveness of medications prescribed to reduce swelling  and congestion of the tubes themselves.  Although you feel brief relief from the bronchodilator inhaler, your asthma may actually be worsening with the tubes becoming more swollen and filled with mucus.  This can delay other crucial treatments, such as oral steroid medications. If you need to use a bronchodilator inhaler daily, several times per day, you should discuss this with your provider.  There are probably better treatments that could be used to keep your asthma under control.     HOME CARE Only take medications as instructed by your medical team. Complete the entire course of an antibiotic. Drink plenty of fluids and get plenty of rest. Avoid close contacts especially the very young and the elderly Cover your mouth if you cough or cough into your sleeve. Always remember to wash your hands A steam or ultrasonic humidifier can help congestion.   GET HELP RIGHT AWAY IF: You develop worsening fever. You become short of breath You cough up blood. Your symptoms persist after you have completed your treatment plan MAKE SURE YOU  Understand these instructions. Will watch your condition. Will get help right away if you are not doing well or get worse.    Thank  you for choosing an e-visit.  Your e-visit answers were reviewed by a board certified advanced clinical practitioner to complete your personal care plan. Depending upon the condition, your plan could have included both over the counter or prescription medications.  Please review your pharmacy choice. Make sure the pharmacy is open so you can pick up prescription now. If there is a problem, you may contact your provider through [employer] and have the prescription routed to another pharmacy.  Your safety is important to [place]. If you have drug allergies check your prescription carefully.   For the next 24 hours you can use MyChart to ask questions about today's visit, request a non-urgent call back, or ask for a work or school  excuse. You will get an email in the next two days asking about your experience. I hope that your e-visit has been valuable and will speed your recovery.  I spent approximately 7 minutes reviewing the patient's history, current symptoms and coordinating their plan of care today.

## 2022-05-26 ENCOUNTER — Other Ambulatory Visit: Payer: Self-pay

## 2022-05-26 ENCOUNTER — Other Ambulatory Visit (HOSPITAL_COMMUNITY): Payer: Self-pay

## 2022-05-29 ENCOUNTER — Encounter: Payer: Self-pay | Admitting: Family Medicine

## 2022-05-29 ENCOUNTER — Ambulatory Visit: Payer: 59 | Admitting: Family Medicine

## 2022-05-29 VITALS — BP 110/68 | HR 86 | Temp 97.8°F | Ht 70.0 in | Wt 231.2 lb

## 2022-05-29 DIAGNOSIS — Z3009 Encounter for other general counseling and advice on contraception: Secondary | ICD-10-CM

## 2022-05-29 NOTE — Progress Notes (Signed)
Established Patient Office Visit   Subjective:  Patient ID: Jason Hurst, male    DOB: 1980-07-07  Age: 42 y.o. MRN: [id number]  Chief Complaint  Patient presents with   Referral    Referral for vasectomy.     HPI Encounter Diagnoses  Name Primary?   Screening and evaluation for vasectomy Yes   As above.  He feels as though this could be a great option for him and his wife.  Their third child together is due on the fifth of next month.  Things are going well with the pregnancy.  They have 4 children together.  Neither he or his wife are interested in other pregnancy.  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  Continues with his weight loss journey.   Review of Systems  Constitutional: Negative.   HENT: Negative.    Eyes:  Negative for blurred vision, discharge and redness.  Respiratory: Negative.    Cardiovascular: Negative.   Gastrointestinal:  Negative for abdominal pain.  Genitourinary: Negative.   Musculoskeletal: Negative.  Negative for myalgias.  Skin:  Negative for rash.  Neurological:  Negative for tingling, loss of consciousness and weakness.  Endo/Heme/Allergies:  Negative for polydipsia.     Current Outpatient Medications:    albuterol (VENTOLIN HFA) 108 (90 Base) MCG/ACT inhaler, Inhale 1-2 puffs into the lungs every 6 (six) hours as needed for wheezing or shortness of breath., Disp: 8 g, Rfl: 3   albuterol (VENTOLIN HFA) 108 (90 Base) MCG/ACT inhaler, Inhale 2 puffs into the lungs every 6 (six) hours as needed for wheezing or shortness of breath., Disp: 8 g, Rfl: 0   azithromycin (ZITHROMAX) 250 MG tablet, Take 2 tablets on day 1, then 1 tablet daily on days 2 through 5, Disp: 6 tablet, Rfl: 0   benzonatate (TESSALON) 100 MG capsule, Take 1 capsule (100 mg total) by mouth 3 (three) times daily as needed., Disp: 30 capsule, Rfl: 0   fluticasone (FLONASE) 50 MCG/ACT nasal spray, Place 2 sprays into both nostrils  daily., Disp: 16 g, Rfl: 6   fluticasone-salmeterol (ADVAIR DISKUS) 250-50 MCG/ACT AEPB, Inhale 1 puff into the lungs in the morning and at bedtime., Disp: 180 each, Rfl: 1   predniSONE (DELTASONE) 20 MG tablet, Take 1 tablet (20 mg total) by mouth 2 (two) times daily with a meal for 5 days., Disp: 10 tablet, Rfl: 0   Objective:     BP 110/68 (BP Location: Left Arm, Patient Position: Sitting, Cuff Size: Large)   Pulse 86   Temp 97.8 F (36.6 C) (Temporal)   Ht 5\' 10"  (1.778 m)   Wt 231 lb 3.2 oz (104.9 kg)   SpO2 96%   BMI 33.17 kg/m  Wt Readings from Last 3 Encounters:  05/29/22 231 lb 3.2 oz (104.9 kg)  02/05/22 236 lb 3.2 oz (107.1 kg)  09/22/21 237 lb (107.5 kg)      Physical Exam Constitutional:      General: He is not in acute distress.    Appearance: Normal appearance. He is not ill-appearing, toxic-appearing or diaphoretic.  HENT:     Head: Normocephalic and atraumatic.     Right Ear: External ear normal.     Left Ear: External ear normal.  Eyes:     General: No scleral icterus.       Right eye: No discharge.        Left eye: No discharge.     Extraocular Movements: Extraocular movements  intact.     Conjunctiva/sclera: Conjunctivae normal.  Pulmonary:     Effort: Pulmonary effort is normal. No respiratory distress.  Skin:    General: Skin is warm and dry.  Neurological:     Mental Status: He is alert and oriented to person, place, and time.  Psychiatric:        Mood and Affect: Mood normal.        Behavior: Behavior normal.      No results found for any visits on 05/29/22.    The ASCVD Risk score (Arnett DK, et al., 2019) failed to calculate for the following reasons:   The 2019 ASCVD risk score is only valid for ages 20 to 47    Assessment & Plan:   Screening and evaluation for vasectomy -     Ambulatory referral to Urology    Return if symptoms worsen or fail to improve.    Libby Maw, MD

## 2022-06-03 NOTE — Progress Notes (Unsigned)
Assessment: 1. Consultation for sterilization      Plan: Today I discussed the issues regarding vasectomy in detail with the patient today and he expressed no reservations.  As to the procedure, no scalpel technique vasectomy is explained and reviewed in detail.  Generalized risks including but not limited to bleeding, infection, orchalgia, testicular atrophy, epididymitis, scrotal hematoma, and chronic pain are discussed.   Additionally, he understands that the possibility of vas recanalization following vasectomy is possible although rare.  Most importantly, the patient understands that he is not sterile initially and will need a semen analysis check to confirm sterility such that no sperm are seen.  He is advised to avoid ejaculation for 10 days following the procedure.  The initial semen analysis will be checked in approximately 12 weeks and in some patients, several months may be required for clearance of all sperm.  He reports a clear understanding of the need for continued birth control until sterility is confirmed.  Otherwise, general issues regarding local anesthesia, prep, alprazolam are discussed and he reports a clear understanding.   Chief Complaint: vasectomy consult   History of Present Illness:  Jason Hurst is a 42 y.o. male who is seen in consultation from Libby Maw, MD for evaluation for vasectomy. Patient and wife have 4 children and have carefully considered the issues of permanent sterilization and are comfortable with their decision.    Past Medical History:  Past Medical History:  Diagnosis Date   COVID-19 long hauler     Past Surgical History:  No past surgical history on file.  Allergies:  No Known Allergies  Family History:  Family History  Problem Relation Age of Onset   Healthy Mother    Diabetes Father    Heart disease Father    Healthy Sister    Healthy Brother    Heart attack Maternal Uncle     Social History:  Social  History   Tobacco Use   Smoking status: Former    Packs/day: 1.50    Years: 22.00    Total pack years: 33.00    Types: Cigarettes    Quit date: 05/05/2018    Years since quitting: 4.0   Smokeless tobacco: Never  Vaping Use   Vaping Use: Never used  Substance Use Topics   Alcohol use: Yes    Comment: very occasionally.    Drug use: Yes    Types: Marijuana    Review of symptoms:  Constitutional:  Negative for unexplained weight loss, night sweats, fever, chills ENT:  Negative for nose bleeds, sinus pain, painful swallowing CV:  Negative for chest pain, shortness of breath, exercise intolerance, palpitations, loss of consciousness Resp:  Negative for cough, wheezing, shortness of breath GI:  Negative for nausea, vomiting, diarrhea, bloody stools GU:  Positives noted in HPI; otherwise negative for gross hematuria, dysuria, urinary incontinence Neuro:  Negative for seizures, poor balance, limb weakness, slurred speech Psych:  Negative for lack of energy, depression, anxiety Endocrine:  Negative for polydipsia, polyuria, symptoms of hypoglycemia (dizziness, hunger, sweating) Hematologic:  Negative for anemia, purpura, petechia, prolonged or excessive bleeding, use of anticoagulants  Allergic:  Negative for difficulty breathing or choking as a result of exposure to anything; no shellfish allergy; no allergic response (rash/itch) to materials, foods  Physical exam: There were no vitals taken for this visit. GENERAL APPEARANCE:  Well appearing, well developed, well nourished, NAD HEENT: Atraumatic, Normocephalic, oropharynx clear. NECK: Supple without lymphadenopathy or thyromegaly. LUNGS: Clear to auscultation bilaterally.  HEART: Regular Rate and Rhythm without murmurs, gallops, or rubs. ABDOMEN: Soft, non-tender, No Masses. EXTREMITIES: Moves all extremities well.  Without clubbing, cyanosis, or edema. NEUROLOGIC:  Alert and oriented x 3, normal gait, CN II-XII grossly intact.   MENTAL STATUS:  Appropriate. BACK:  Non-tender to palpation.  No CVAT SKIN:  Warm, dry and intact.    Results: No results found for this or any previous visit (from the past 24 hour(s)).

## 2022-06-04 ENCOUNTER — Encounter: Payer: Self-pay | Admitting: Urology

## 2022-06-04 ENCOUNTER — Ambulatory Visit: Payer: 59 | Admitting: Urology

## 2022-06-04 VITALS — BP 121/81 | HR 76 | Ht 70.0 in | Wt 227.0 lb

## 2022-06-04 DIAGNOSIS — Z3009 Encounter for other general counseling and advice on contraception: Secondary | ICD-10-CM | POA: Diagnosis not present

## 2022-06-10 ENCOUNTER — Encounter: Payer: Self-pay | Admitting: Family Medicine

## 2022-06-10 ENCOUNTER — Ambulatory Visit: Payer: 59 | Admitting: Family Medicine

## 2022-06-10 VITALS — BP 136/84 | HR 75 | Temp 97.8°F | Wt 238.8 lb

## 2022-06-10 DIAGNOSIS — R079 Chest pain, unspecified: Secondary | ICD-10-CM | POA: Diagnosis not present

## 2022-06-10 DIAGNOSIS — J029 Acute pharyngitis, unspecified: Secondary | ICD-10-CM | POA: Diagnosis not present

## 2022-06-10 LAB — POCT RAPID STREP A (OFFICE): Rapid Strep A Screen: NEGATIVE

## 2022-06-10 MED ORDER — AMOXICILLIN-POT CLAVULANATE 875-125 MG PO TABS: 1.0000 | ORAL_TABLET | Freq: Two times a day (BID) | ORAL | 0 refills | Status: DC

## 2022-06-10 NOTE — Patient Instructions (Signed)
Sore Throat A sore throat is pain, burning, irritation, or scratchiness in the throat. When you have a sore throat, you may feel pain or tenderness in your throat when you swallow or talk. Many things can cause a sore throat, including: An infection. Seasonal allergies. Dryness in the air. Irritants, such as smoke or pollution. Radiation treatment for cancer. Gastroesophageal reflux disease (GERD). A tumor. A sore throat is often the first sign of another sickness. It may happen with other symptoms, such as coughing, sneezing, fever, and swollen neck glands. Most sore throats go away without medical treatment. Follow these instructions at home:     Medicines Take over-the-counter and prescription medicines only as told by your health care provider. Children often get sore throats. Do not give your child aspirin because of the association with Reye's syndrome. Use throat sprays to soothe your throat as told by your health care provider. Managing pain To help with pain, try: Sipping warm liquids, such as broth, herbal tea, or warm water. Eating or drinking cold or frozen liquids, such as frozen ice pops. Gargling with a mixture of salt and water 3-4 times a day or as needed. To make salt water, completely dissolve -1 tsp (3-6 g) of salt in 1 cup (237 mL) of warm water. Sucking on hard candy or throat lozenges. Putting a cool-mist humidifier in your bedroom at night to moisten the air. Sitting in the bathroom with the door closed for 5-10 minutes while you run hot water in the shower. General instructions Do not use any products that contain nicotine or tobacco. These products include cigarettes, chewing tobacco, and vaping devices, such as e-cigarettes. If you need help quitting, ask your health care provider. Rest as needed. Drink enough fluid to keep your urine pale yellow. Wash your hands often with soap and water for at least 20 seconds. If soap and water are not available, use hand  sanitizer. Contact a health care provider if: You have a fever for more than 2-3 days. You have symptoms that last for more than 2-3 days. Your throat does not get better within 7 days. You have a fever and your symptoms suddenly get worse. Get help right away if: You have difficulty breathing. You cannot swallow fluids, soft foods, or your saliva. You have increased swelling in your throat or neck. You have persistent nausea and vomiting. These symptoms may represent a serious problem that is an emergency. Do not wait to see if the symptoms will go away. Get medical help right away. Call your local emergency services (911 in the [place] Do not drive yourself to the hospital. Summary A sore throat is pain, burning, irritation, or scratchiness in the throat. Many things can cause a sore throat. Take over-the-counter medicines only as told by your health care provider. Rest as needed. Drink enough fluid to keep your urine pale yellow. Contact a health care provider if your throat does not get better within 7 days. This information is not intended to replace advice given to you by your health care provider. Make sure you discuss any questions you have with your health care provider. Document Revised: 07/17/2020 Document Reviewed: 07/17/2020 Elsevier Patient Education  [employer].

## 2022-06-10 NOTE — Progress Notes (Signed)
Assessment/Plan:   Problem List Items Addressed This Visit       Other   Sore throat - Primary    Given the patient's significant exposure to strep throat from his family, it is likely he has contracted strep throat despite the negative rapid test.  Differential diagnosis:  Streptococcal pharyngitis: The symptoms of sore throat and exposure to confirmed cases in the household make this the most likely diagnosis. Viral pharyngitis: Cannot be ruled out due to the negative strep test; however, the patient's exposure and symptoms point more towards strep. Non-strep bacterial pharyngitis: Other bacteria can cause sore throat, but strep is the most common.  Plan: Prescribe a 10-day course of Augmentin (amoxicillin/clavulanate) for the presumed strep throat infection, as exposure and clinical picture suggest a high likelihood of strep infection despite the negative test. Observe response to treatment, and if the patient does not improve, consider re-evaluation, and possibly a throat culture for confirmation.      Relevant Medications   amoxicillin-clavulanate (AUGMENTIN) 875-125 MG tablet   Other Relevant Orders   POCT rapid strep A (Completed)   Chest pain    Chest pain likely of a musculoskeletal nature, potentially exacerbated by chronic respiratory symptoms.there is low likelihood of cardiac involvement based on history given.   Plan: Patient advised to monitor chest pain and instructed the patient to seek immediate medical attention if he experiences worsening of chest pain, the onset of acute severe chest pain, shortness of breath, diaphoresis, nausea, or any other new cardiovascular symptoms.       There are no discontinued medications.    Subjective:  HPI: Encounter date: 06/10/2022  DAHMIR EPPERLY is a 42 y.o. male who has Tobacco abuse disorder; On Chantix therapy; Lateral epicondylitis of right elbow; Plantar fasciitis, bilateral; Elevated glucose; Screening and  evaluation for vasectomy; History of COVID-19; Cough; Encounter for removal of sutures; COVID-19 long hauler manifesting chronic dyspnea; Moderate persistent asthma without complication; COPD with asthma; Gastroesophageal reflux disease; Elevated BP without diagnosis of hypertension; Sore throat; and Chest pain on their problem list..   He  has a past medical history of COVID-19 long hauler..   CHIEF COMPLAINT: The patient presents with a sore throat and has had exposure to strep throat through his wife and son, who have tested positive. Additionally, he mentions recent chest pain and intermittent lightheadedness.  HISTORY OF PRESENT ILLNESS:  Problem 1: The patient has been experiencing a sore throat for approximately 2 days. Despite a negative rapid strep test, given his significant exposure to family members with confirmed strep throat, a presumptive diagnosis of strep throat is considered. The patient reports being able to swallow and denies fever or chills. He has a history of shortness of breath but uses inhalers for this issue, and there have been no new respiratory symptoms. He has been taking Ibuprofen, which has helped with the throat pain and also ongoing elbow pain.  Problem 2: The patient experienced an episode of chest pain that he did not attribute to his sore throat and described as feeling muscular in nature. He has not had similar chest pain previously and reports the pain was controlled with Ibuprofen, which he takes regularly for elbow pain.  REVIEW OF SYSTEMS: The patient reports mild lightheadedness earlier in the day and chest pain a few days prior. No fever, chills, cough, or other respiratory or cardiac symptoms were noted.  No past surgical history on file.  Outpatient Medications Prior to Visit  Medication Sig Dispense  Refill   albuterol (VENTOLIN HFA) 108 (90 Base) MCG/ACT inhaler Inhale 1-2 puffs into the lungs every 6 (six) hours as needed for wheezing or shortness of  breath. 8 g 3   albuterol (VENTOLIN HFA) 108 (90 Base) MCG/ACT inhaler Inhale 2 puffs into the lungs every 6 (six) hours as needed for wheezing or shortness of breath. 8 g 0   fluticasone (FLONASE) 50 MCG/ACT nasal spray Place 2 sprays into both nostrils daily. 16 g 6   fluticasone-salmeterol (ADVAIR DISKUS) 250-50 MCG/ACT AEPB Inhale 1 puff into the lungs in the morning and at bedtime. 180 each 1   benzonatate (TESSALON) 100 MG capsule Take 1 capsule (100 mg total) by mouth 3 (three) times daily as needed. (Patient not taking: Reported on 06/04/2022) 30 capsule 0   No facility-administered medications prior to visit.    Family History  Problem Relation Age of Onset   Healthy Mother    Diabetes Father    Heart disease Father    Healthy Sister    Healthy Brother    Heart attack Maternal Uncle     Social History   Socioeconomic History   Marital status: Married    Spouse name: Not on file   Number of children: Not on file   Years of education: Not on file   Highest education level: Not on file  Occupational History   Not on file  Tobacco Use   Smoking status: Former    Packs/day: 1.50    Years: 22.00    Total pack years: 33.00    Types: Cigarettes    Quit date: 05/05/2018    Years since quitting: 4.1   Smokeless tobacco: Never  Vaping Use   Vaping Use: Never used  Substance and Sexual Activity   Alcohol use: Yes    Comment: very occasionally.    Drug use: Yes    Types: Marijuana   Sexual activity: Yes  Other Topics Concern   Not on file  Social History Narrative   Not on file   Social Determinants of Health   Financial Resource Strain: Not on file  Food Insecurity: Not on file  Transportation Needs: Not on file  Physical Activity: Not on file  Stress: Not on file  Social Connections: Not on file  Intimate Partner Violence: Not on file                                                                                                 Objective:  Physical  Exam: BP 136/84 (BP Location: Left Arm, Patient Position: Sitting, Cuff Size: Large)   Pulse 75   Temp 97.8 F (36.6 C) (Temporal)   Wt 238 lb 12.8 oz (108.3 kg)   SpO2 99%   BMI 34.26 kg/m    General: No acute distress. Awake and conversant.  Eyes: Normal conjunctiva, anicteric. Round symmetric pupils.  ENT: Hearing grossly intact. No nasal discharge. Mild erythema observed in the pharynx.  Moist mucous membranes Neck: Neck is supple. No masses or thyromegaly.  Respiratory: Respirations are non-labored.  Clear to auscultation bilaterally Skin: Warm. No rashes or  ulcers.  Psych: Alert and oriented. Cooperative, Appropriate mood and affect, Normal judgment.  CV: No cyanosis or JVD, normal S1-S2, RRR, no MRG MSK: Normal ambulation. No clubbing  Neuro: Sensation and CN II-XII grossly normal.   Results for orders placed or performed in visit on 06/10/22  POCT rapid strep A  Result Value Ref Range   Rapid Strep A Screen Negative Negative        Alesia Banda, MD, MS

## 2022-06-10 NOTE — Assessment & Plan Note (Signed)
Chest pain likely of a musculoskeletal nature, potentially exacerbated by chronic respiratory symptoms.there is low likelihood of cardiac involvement based on history given.   Plan: Patient advised to monitor chest pain and instructed the patient to seek immediate medical attention if he experiences worsening of chest pain, the onset of acute severe chest pain, shortness of breath, diaphoresis, nausea, or any other new cardiovascular symptoms.

## 2022-06-10 NOTE — Assessment & Plan Note (Signed)
Given the patient's significant exposure to strep throat from his family, it is likely he has contracted strep throat despite the negative rapid test.  Differential diagnosis:  Streptococcal pharyngitis: The symptoms of sore throat and exposure to confirmed cases in the household make this the most likely diagnosis. Viral pharyngitis: Cannot be ruled out due to the negative strep test; however, the patient's exposure and symptoms point more towards strep. Non-strep bacterial pharyngitis: Other bacteria can cause sore throat, but strep is the most common.  Plan: Prescribe a 10-day course of Augmentin (amoxicillin/clavulanate) for the presumed strep throat infection, as exposure and clinical picture suggest a high likelihood of strep infection despite the negative test. Observe response to treatment, and if the patient does not improve, consider re-evaluation, and possibly a throat culture for confirmation.

## 2022-09-11 ENCOUNTER — Telehealth: Payer: 59 | Admitting: Emergency Medicine

## 2022-09-11 DIAGNOSIS — J441 Chronic obstructive pulmonary disease with (acute) exacerbation: Secondary | ICD-10-CM

## 2022-09-11 MED ORDER — ALBUTEROL SULFATE HFA 108 (90 BASE) MCG/ACT IN AERS: 2.0000 | INHALATION_SPRAY | RESPIRATORY_TRACT | 3 refills | Status: DC | PRN

## 2022-09-11 MED ORDER — PREDNISONE 20 MG PO TABS: 40.0000 mg | ORAL_TABLET | Freq: Every day | ORAL | 0 refills | Status: DC

## 2022-09-11 NOTE — Progress Notes (Signed)
E-Visit for COPD  Based on what you have shared with me, it looks like you may have a flare up of your COPD.    COPD symptoms vary from person to person, with common symptoms including nighttime awakening and decreased ability to participate in normal activities as a result of shortness of breath. It is often triggered by changes in weather, changes in the season, changes in air temperature, or inside (home, school, daycare or work) allergens such as animal dander, mold, mildew, woodstoves or cockroaches.   It can also be triggered by hormonal changes, extreme emotion, physical exertion or an upper respiratory tract illness.     It is important to identify the trigger, and then eliminate or avoid the trigger if possible.   If you have been prescribed medications to be taken on a regular basis, it is important to follow the asthma action plan and to follow guidelines to adjust medication in response to increasing symptoms of decreased peak expiratory flow rate  Treatment: I have prescribed: Albuterol (Proventil HFA; Ventolin HFA) 108 (90 Base) MCG/ACT Inhaler 2 puffs into the lungs every six hours as needed for wheezing or shortness of breath and Prednisone 40mg  by mouth per day for 5 - 7 days  HOME CARE Only take medications as instructed by your medical team. Consider wearing a mask or scarf to improve breathing air temperature have been shown to decrease irritation and decrease exacerbations Get rest. Taking a steamy shower or using a humidifier may help nasal congestion sand ease sore throat pain. You can place a towel over your head and breathe in the steam from hot water coming from a faucet. Using a saline nasal spray works much the same way.  Cough drops, hare candies and sore throat lozenges may ease your cough.  Avoid close contacts especially the very you and the  elderly Cover your mouth if you cough or sneeze Always remember to wash your hands.    GET HELP RIGHT AWAY IF: You develop worsening symptoms; breathlessness at rest, drowsy, confused or agitated, unable to speak in full sentences You have coughing fits You develop a severe headache or visual changes You develop shortness of breath, difficulty breathing or start having chest pain Your symptoms persist after you have completed your treatment plan If your symptoms do not improve within 10 days  MAKE SURE YOU Understand these instructions. Will watch your condition. Will get help right away if you are not doing well or get worse.   Your e-visit answers were reviewed by a board certified advanced clinical practitioner to complete your personal care plan, Depending upon the condition, your plan could have included both over the counter or prescription medications.   Please review your pharmacy choice. Your safety is important to [place]. If you have drug allergies check your prescription carefully.  You can use MyChart to ask questions about today's visit, request a non-urgent  call back, or ask for a work or school excuse for 24 hours related to this e-Visit. If it has been greater than 24 hours you will need to follow up with your provider, or enter a new e-Visit to address those concerns.   You will get an e-mail in the next two days asking about your experience. I hope that your e-visit has been valuable and will speed your recovery. Thank you for using e-visits.  Approximately 5 minutes was used in reviewing the patient's chart, questionnaire, prescribing medications, and documentation.

## 2022-10-20 IMAGING — CT CT CHEST W/O CM
1 series · 15 of 34 positions shown, 19 images · non-contrast
Comparison: No priors.

CLINICAL DATA: 41-year-old male with history of prior COVID
infection. Chronic bronchitis. Former smoker.



[Series 2: chest w/o 2mm st · axial · non-contrast · 0.80mm/px · z∈[-309,-33]mm · 15 of 163 slices shown, 19 images]
[im 13/163  mediastinal]
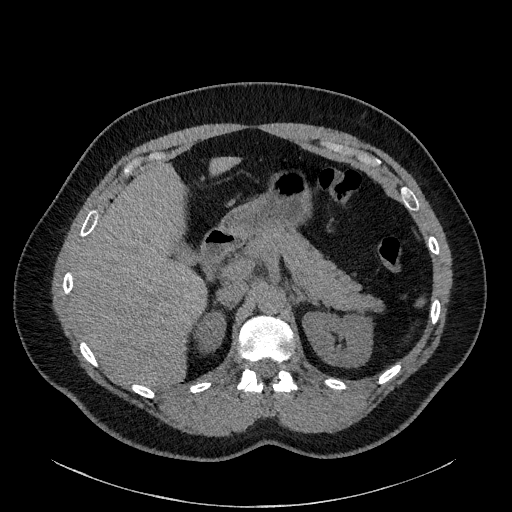
[im 13/163  lung]
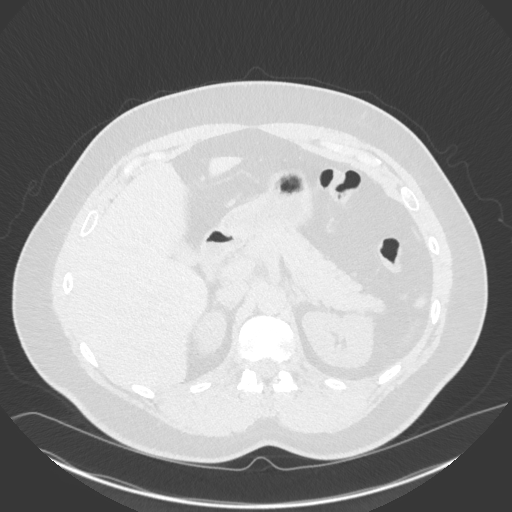
[im 25/163  lung]
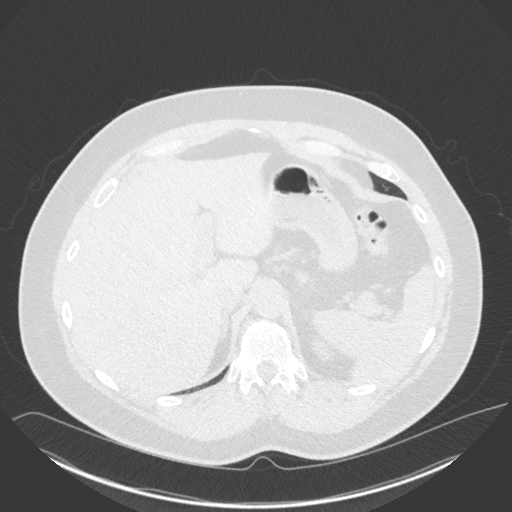
[im 33/163  lung]
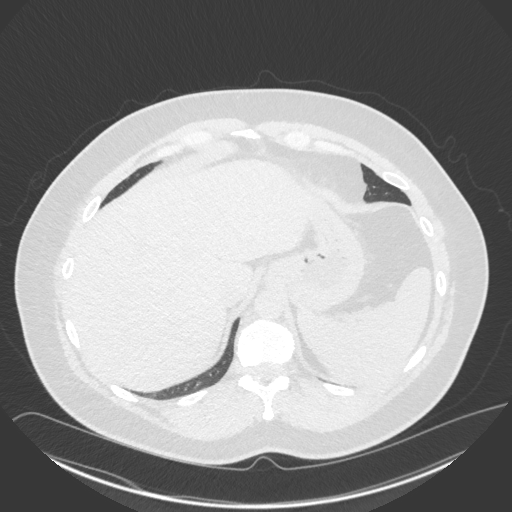
[im 43/163  lung]
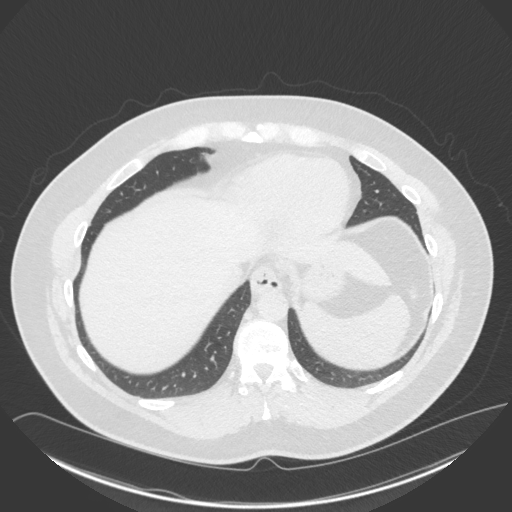
[im 55/163  mediastinal]
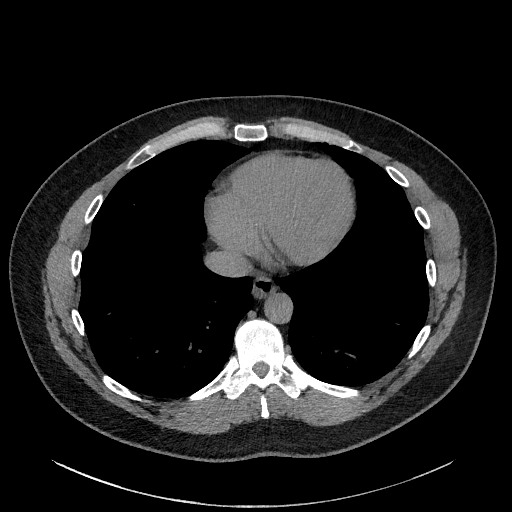
[im 55/163  lung]
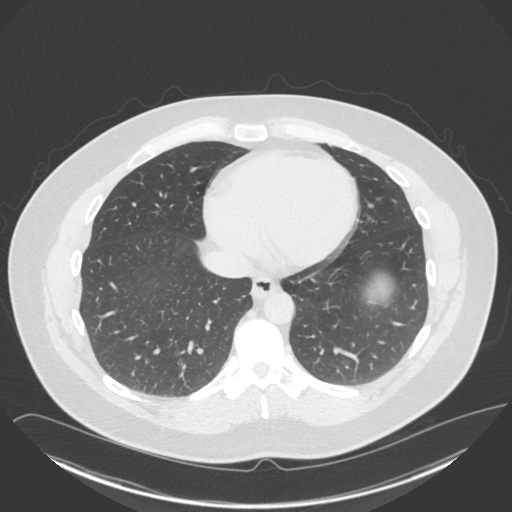
[im 65/163  lung]
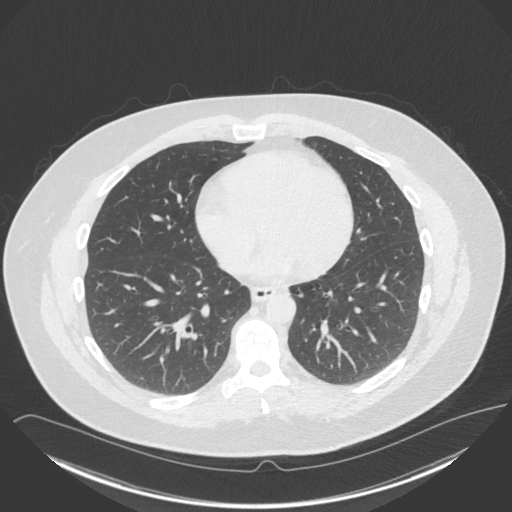
[im 73/163  lung]
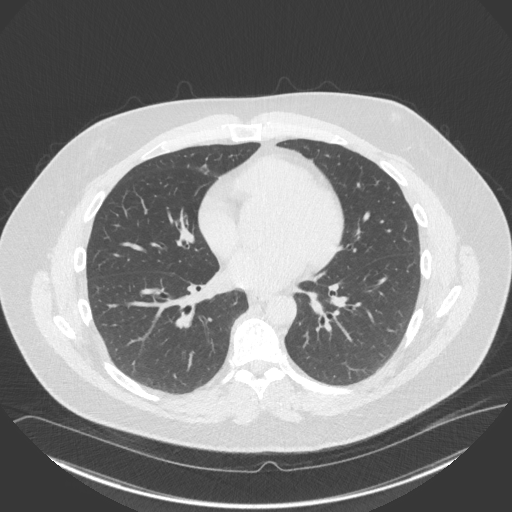
[im 85/163  lung]
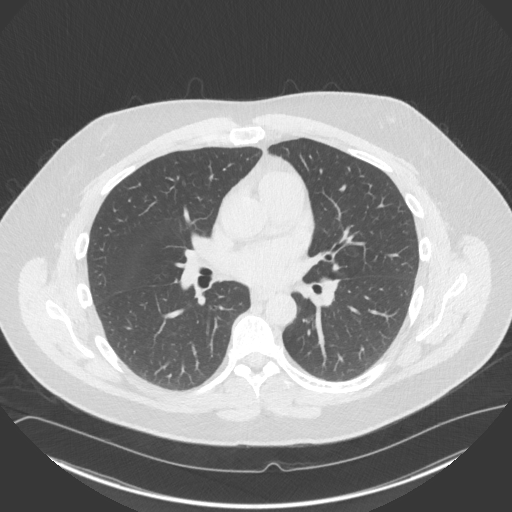
[im 91/163  mediastinal]
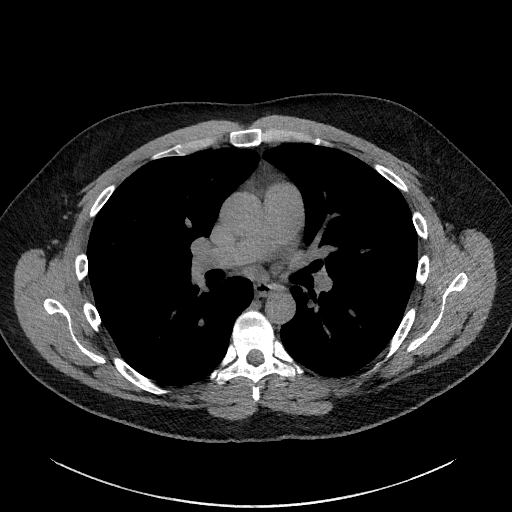
[im 91/163  lung]
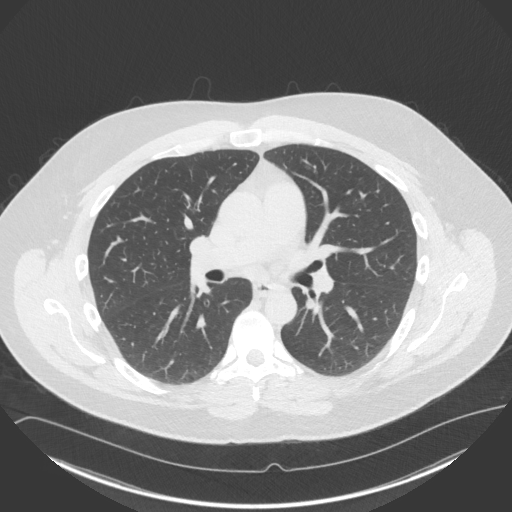
[im 98/163  lung]
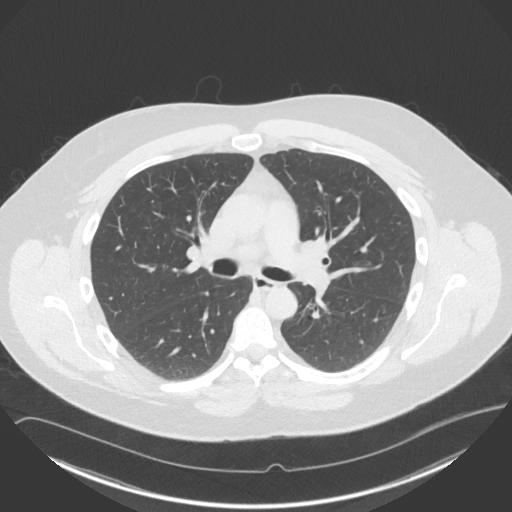
[im 109/163  lung]
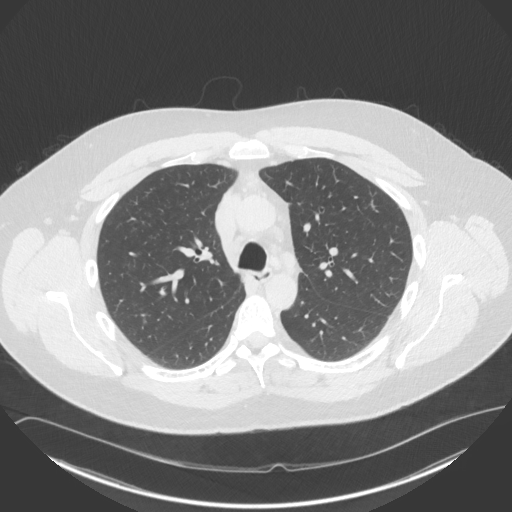
[im 121/163  lung]
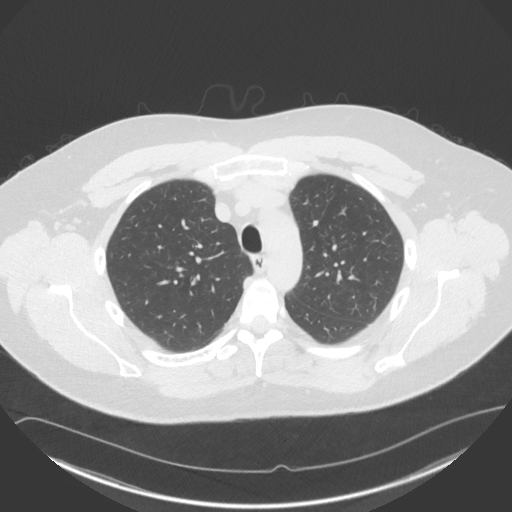
[im 130/163  mediastinal]
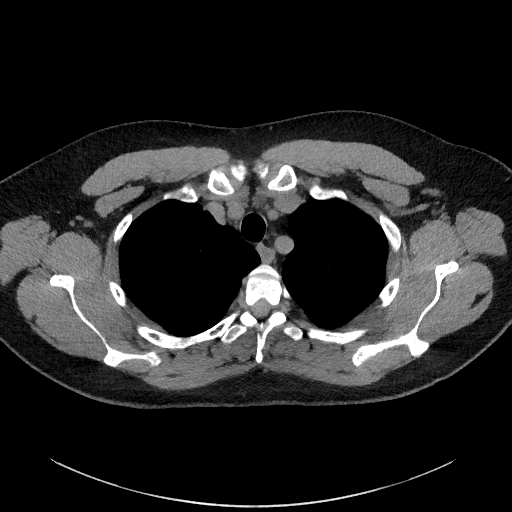
[im 130/163  lung]
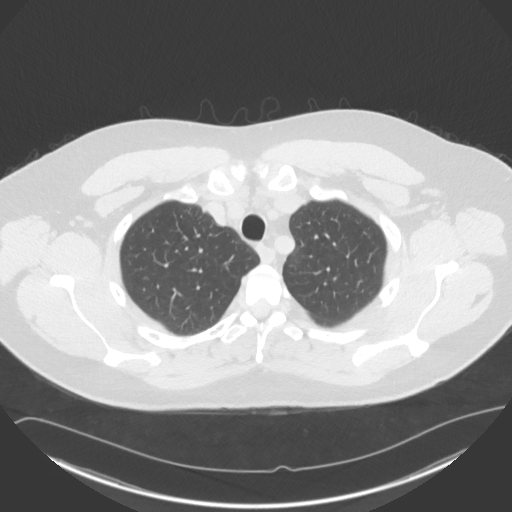
[im 139/163  lung]
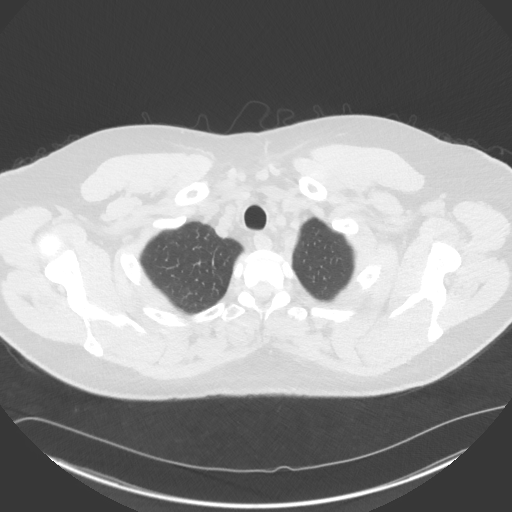
[im 151/163  lung]
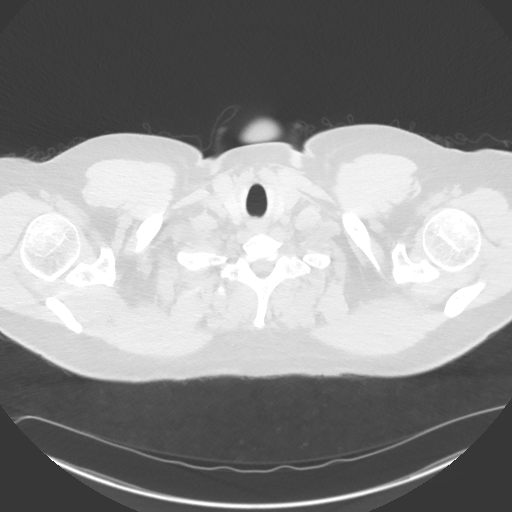

[15 of 34 positions shown; findings below may reference images not displayed]

FINDINGS: Cardiovascular: Heart size is normal. There is no significant
pericardial fluid, thickening or pericardial calcification. No
atherosclerotic calcifications are noted in the thoracic aorta or
the coronary arteries.

Mediastinum/Nodes: No pathologically enlarged mediastinal or hilar
lymph nodes. Please note that accurate exclusion of hilar adenopathy
is limited on noncontrast CT scans. Esophagus is unremarkable in
appearance. No axillary lymphadenopathy.

Lungs/Pleura: No acute consolidative airspace disease. No pleural
effusions. Mild diffuse bronchial wall thickening with very mild
centrilobular and paraseptal emphysema. No definite suspicious
appearing pulmonary nodules or masses are noted.

Upper Abdomen: Unremarkable.

Musculoskeletal: There are no aggressive appearing lytic or blastic
lesions noted in the visualized portions of the skeleton.
IMPRESSION: 1. No acute findings are noted in the thorax.
2. Mild diffuse bronchial wall thickening with very mild
centrilobular and paraseptal emphysema; imaging findings suggestive
of underlying COPD.

## 2022-11-05 ENCOUNTER — Telehealth: Payer: 59 | Admitting: Nurse Practitioner

## 2022-11-05 DIAGNOSIS — J209 Acute bronchitis, unspecified: Secondary | ICD-10-CM

## 2022-11-05 DIAGNOSIS — J440 Chronic obstructive pulmonary disease with acute lower respiratory infection: Secondary | ICD-10-CM

## 2022-11-05 MED ORDER — AZITHROMYCIN 250 MG PO TABS: ORAL_TABLET | ORAL | 0 refills | Status: AC

## 2022-11-05 MED ORDER — BENZONATATE 100 MG PO CAPS: 100.0000 mg | ORAL_CAPSULE | Freq: Three times a day (TID) | ORAL | 0 refills | Status: DC | PRN

## 2022-11-05 MED ORDER — PREDNISONE 20 MG PO TABS: 20.0000 mg | ORAL_TABLET | Freq: Two times a day (BID) | ORAL | 0 refills | Status: AC

## 2022-11-05 NOTE — Addendum Note (Signed)
Addended by: Viviano Simas E on: 11/05/2022 10:30 AM   Modules accepted: Level of Service

## 2022-11-05 NOTE — Progress Notes (Signed)
E-Visit for Cough  We are sorry that you are not feeling well.  Here is how we plan to help!  Based on your presentation I believe you most likely have A cough due to bacteria.  When patients have a fever and a productive cough with a change in color or increased sputum production, we are concerned about bacterial bronchitis.  If left untreated it can progress to pneumonia.  If your symptoms do not improve with your treatment plan it is important that you contact your provider.   I have prescribed Azithromyin 250 mg: two tablets now and then one tablet daily for 4 additonal days    In addition you may use A prescription cough medication called Tessalon Perles 100mg. You may take 1-2 capsules every 8 hours as needed for your cough.  Prednisone 20 mg twice daily for 5 days   From your responses in the eVisit questionnaire you describe inflammation in the upper respiratory tract which is causing a significant cough.  This is commonly called Bronchitis and has four common causes:   Allergies Viral Infections Acid Reflux Bacterial Infection Allergies, viruses and acid reflux are treated by controlling symptoms or eliminating the cause. An example might be a cough caused by taking certain blood pressure medications. You stop the cough by changing the medication. Another example might be a cough caused by acid reflux. Controlling the reflux helps control the cough.  USE OF BRONCHODILATOR ("RESCUE") INHALERS: There is a risk from using your bronchodilator too frequently.  The risk is that over-reliance on a medication which only relaxes the muscles surrounding the breathing tubes can reduce the effectiveness of medications prescribed to reduce swelling and congestion of the tubes themselves.  Although you feel brief relief from the bronchodilator inhaler, your asthma may actually be worsening with the tubes becoming more swollen and filled with mucus.  This can delay other crucial treatments, such as oral  steroid medications. If you need to use a bronchodilator inhaler daily, several times per day, you should discuss this with your provider.  There are probably better treatments that could be used to keep your asthma under control.     HOME CARE Only take medications as instructed by your medical team. Complete the entire course of an antibiotic. Drink plenty of fluids and get plenty of rest. Avoid close contacts especially the very young and the elderly Cover your mouth if you cough or cough into your sleeve. Always remember to wash your hands A steam or ultrasonic humidifier can help congestion.   GET HELP RIGHT AWAY IF: You develop worsening fever. You become short of breath You cough up blood. Your symptoms persist after you have completed your treatment plan MAKE SURE YOU  Understand these instructions. Will watch your condition. Will get help right away if you are not doing well or get worse.    Thank you for choosing an e-visit.  Your e-visit answers were reviewed by a board certified advanced clinical practitioner to complete your personal care plan. Depending upon the condition, your plan could have included both over the counter or prescription medications.  Please review your pharmacy choice. Make sure the pharmacy is open so you can pick up prescription now. If there is a problem, you may contact your provider through MyChart messaging and have the prescription routed to another pharmacy.  Your safety is important to us. If you have drug allergies check your prescription carefully.   For the next 24 hours you can use MyChart   to ask questions about today's visit, request a non-urgent call back, or ask for a work or school excuse. You will get an email in the next two days asking about your experience. I hope that your e-visit has been valuable and will speed your recovery.   Meds ordered this encounter  Medications   azithromycin (ZITHROMAX) 250 MG tablet    Sig: Take 2  tablets on day 1, then 1 tablet daily on days 2 through 5    Dispense:  6 tablet    Refill:  0   benzonatate (TESSALON) 100 MG capsule    Sig: Take 1 capsule (100 mg total) by mouth 3 (three) times daily as needed.    Dispense:  30 capsule    Refill:  0   predniSONE (DELTASONE) 20 MG tablet    Sig: Take 1 tablet (20 mg total) by mouth 2 (two) times daily with a meal for 5 days.    Dispense:  10 tablet    Refill:  0    I spent approximately 5 minutes reviewing the patient's history, current symptoms and coordinating their care today.   

## 2022-11-06 ENCOUNTER — Other Ambulatory Visit: Payer: Self-pay

## 2022-11-06 ENCOUNTER — Other Ambulatory Visit (HOSPITAL_COMMUNITY): Payer: Self-pay

## 2022-11-06 ENCOUNTER — Encounter: Payer: Self-pay | Admitting: Pharmacist

## 2022-11-11 ENCOUNTER — Other Ambulatory Visit: Payer: Self-pay

## 2022-11-14 ENCOUNTER — Other Ambulatory Visit (HOSPITAL_COMMUNITY): Payer: Self-pay

## 2022-12-02 ENCOUNTER — Telehealth: Payer: 59 | Admitting: Nurse Practitioner

## 2022-12-02 DIAGNOSIS — J441 Chronic obstructive pulmonary disease with (acute) exacerbation: Secondary | ICD-10-CM

## 2022-12-02 MED ORDER — BENZONATATE 100 MG PO CAPS: 100.0000 mg | ORAL_CAPSULE | Freq: Three times a day (TID) | ORAL | 0 refills | Status: DC | PRN

## 2022-12-02 MED ORDER — PREDNISONE 10 MG (21) PO TBPK: ORAL_TABLET | ORAL | 0 refills | Status: DC

## 2022-12-02 NOTE — Progress Notes (Signed)
E-Visit for Cough  We are sorry that you are not feeling well.  Here is how we plan to help!  Based on your presentation I believe you most likely have A cough due to COPD. Since you were already treated with antibiotic this month  I recommend that you start with prednisone, and if this is successful you may need to talk to pulmonology about an inhaled steroid to use moving forward for better control, especially in the summer.    In addition you may use A prescription cough medication called Tessalon Perles 100mg . You may take 1-2 capsules every 8 hours as needed for your cough.  Prednisone 10 mg daily for 6 days (see taper instructions below) Meds ordered this encounter  Medications   benzonatate (TESSALON) 100 MG capsule    Sig: Take 1 capsule (100 mg total) by mouth 3 (three) times daily as needed.    Dispense:  30 capsule    Refill:  0   predniSONE (STERAPRED UNI-PAK 21 TAB) 10 MG (21) TBPK tablet    Sig: Take 6 tablets on day one, 5 on day two, 4 on day three, 3 on day four, 2 on day five, and 1 on day six. Take with food.    Dispense:  21 tablet    Refill:  0    From your responses in the eVisit questionnaire you describe inflammation in the upper respiratory tract which is causing a significant cough.  This is commonly called Bronchitis and has four common causes:   Allergies Viral Infections Acid Reflux Bacterial Infection Allergies, viruses and acid reflux are treated by controlling symptoms or eliminating the cause. An example might be a cough caused by taking certain blood pressure medications. You stop the cough by changing the medication. Another example might be a cough caused by acid reflux. Controlling the reflux helps control the cough.  USE OF BRONCHODILATOR ("RESCUE") INHALERS: There is a risk from using your bronchodilator too frequently.  The risk is that over-reliance on a medication which only relaxes the muscles surrounding the breathing tubes can reduce the  effectiveness of medications prescribed to reduce swelling and congestion of the tubes themselves.  Although you feel brief relief from the bronchodilator inhaler, your asthma may actually be worsening with the tubes becoming more swollen and filled with mucus.  This can delay other crucial treatments, such as oral steroid medications. If you need to use a bronchodilator inhaler daily, several times per day, you should discuss this with your provider.  There are probably better treatments that could be used to keep your asthma under control.     HOME CARE Only take medications as instructed by your medical team. Complete the entire course of an antibiotic. Drink plenty of fluids and get plenty of rest. Avoid close contacts especially the very young and the elderly Cover your mouth if you cough or cough into your sleeve. Always remember to wash your hands A steam or ultrasonic humidifier can help congestion.   GET HELP RIGHT AWAY IF: You develop worsening fever. You become short of breath You cough up blood. Your symptoms persist after you have completed your treatment plan MAKE SURE YOU  Understand these instructions. Will watch your condition. Will get help right away if you are not doing well or get worse.    Thank you for choosing an e-visit.  Your e-visit answers were reviewed by a board certified advanced clinical practitioner to complete your personal care plan. Depending upon the condition, your  plan could have included both over the counter or prescription medications.  Please review your pharmacy choice. Make sure the pharmacy is open so you can pick up prescription now. If there is a problem, you may contact your provider through [employer] and have the prescription routed to another pharmacy.  Your safety is important to [place]. If you have drug allergies check your prescription carefully.   For the next 24 hours you can use MyChart to ask questions about today's visit,  request a non-urgent call back, or ask for a work or school excuse. You will get an email in the next two days asking about your experience. I hope that your e-visit has been valuable and will speed your recovery.   I spent approximately 5 minutes reviewing the patient's history, current symptoms and coordinating their care today.

## 2022-12-31 ENCOUNTER — Telehealth: Payer: 59 | Admitting: Physician Assistant

## 2022-12-31 DIAGNOSIS — J4541 Moderate persistent asthma with (acute) exacerbation: Secondary | ICD-10-CM | POA: Diagnosis not present

## 2022-12-31 DIAGNOSIS — J9801 Acute bronchospasm: Secondary | ICD-10-CM

## 2022-12-31 MED ORDER — ALBUTEROL SULFATE HFA 108 (90 BASE) MCG/ACT IN AERS: 2.0000 | INHALATION_SPRAY | Freq: Four times a day (QID) | RESPIRATORY_TRACT | 1 refills | Status: DC | PRN

## 2022-12-31 MED ORDER — PREDNISONE 20 MG PO TABS
20.0000 mg | ORAL_TABLET | Freq: Two times a day (BID) | ORAL | 0 refills | Status: DC
Start: 2022-12-31 — End: 2023-01-08

## 2022-12-31 MED ORDER — BENZONATATE 200 MG PO CAPS
200.0000 mg | ORAL_CAPSULE | Freq: Three times a day (TID) | ORAL | 0 refills | Status: DC | PRN
Start: 2022-12-31 — End: 2023-01-08

## 2022-12-31 NOTE — Progress Notes (Signed)
E-Visit for Asthma  Based on what you have shared with me, it looks like you may have a flare up of your asthma.  Asthma is a chronic (ongoing) lung disease which results in airway obstruction, inflammation and hyper-responsiveness.   Asthma symptoms vary from person to person, with common symptoms including nighttime awakening and decreased ability to participate in normal activities as a result of shortness of breath. It is often triggered by changes in weather, changes in the season, changes in air temperature, or inside (home, school, daycare or work) allergens such as animal dander, mold, mildew, woodstoves or cockroaches.   It can also be triggered by hormonal changes, extreme emotion, physical exertion or an upper respiratory tract illness.     It is important to identify the trigger, and then eliminate or avoid the trigger if possible.   If you have been prescribed medications to be taken on a regular basis, it is important to follow the asthma action plan and to follow guidelines to adjust medication in response to increasing symptoms of decreased peak expiratory flow rate  Treatment: I have prescribed: Albuterol (Proventil HFA; Ventolin HFA) 108 (90 Base) MCG/ACT Inhaler 2 puffs into the lungs every six hours as needed for wheezing or shortness of breath and Prednisone 40mg  by mouth per day for 5 - 7 days. I have also prescribed cough suppressant, Benzonatate 200mg  taken three times a day as needed for cough.   I reviewed your chart and see that you've hand multiple visits for your exacerbation of asthma/COPD, but your last visit with the Pulmonologist was back in 05/2022. I highly recommend that you schedule another visit for further evaluation and necessary management with the Pulmonology clinic. I'm sending Rxs as stated above to your preferred pharmacy. I hope you feel better.  Consider going to the ER if symptoms worsen.  HOME CARE Only take medications as instructed by your medical team. Consider wearing a mask or scarf to improve breathing air temperature have been shown to decrease irritation and decrease exacerbations Get rest. Taking a steamy shower or using a humidifier may help nasal congestion sand ease sore throat pain. You can place a towel over your head and breathe in the steam from hot water coming from a faucet. Using a saline nasal spray works much the same way.  Cough drops, hare candies and sore throat lozenges may ease your cough.  Avoid close contacts especially the very you and the elderly Cover your mouth if you cough or sneeze Always remember to wash your hands.    GET HELP RIGHT AWAY IF: You develop worsening symptoms; breathlessness at rest, drowsy, confused or agitated, unable to speak in full sentences You have coughing fits You develop a severe headache or visual changes You develop shortness of breath, difficulty breathing or start having chest pain Your symptoms persist after you have completed your treatment plan If your symptoms do not improve within 10 days  MAKE SURE YOU Understand these instructions. Will watch your condition. Will get help right away if you are not doing well or get worse.   Your e-visit answers were reviewed by a board certified advanced clinical practitioner to complete your personal care plan, Depending upon the condition, your plan could have included both over the counter or prescription medications.   Please review your pharmacy choice. Your safety is important to [place]. If you have drug allergies check your prescription carefully.  You can use MyChart to ask questions about today's visit, request a non-urgent  call back, or ask for a work or school excuse for 24 hours related to this e-Visit. If it has been greater than 24 hours you will need to follow up with your provider, or enter a new e-Visit to  address those concerns.   You will get an e-mail in the next two days asking about your experience. I hope that your e-visit has been valuable and will speed your recovery. Thank you for using e-visits.   I have spent 5 minutes in review of e-visit questionnaire, review and updating patient chart, medical decision making and response to patient.   Gilberto Better, [name]

## 2023-01-08 ENCOUNTER — Other Ambulatory Visit (HOSPITAL_COMMUNITY): Payer: Self-pay

## 2023-01-08 ENCOUNTER — Encounter (HOSPITAL_BASED_OUTPATIENT_CLINIC_OR_DEPARTMENT_OTHER): Payer: Self-pay | Admitting: Pulmonary Disease

## 2023-01-08 ENCOUNTER — Ambulatory Visit (HOSPITAL_BASED_OUTPATIENT_CLINIC_OR_DEPARTMENT_OTHER): Payer: 59 | Admitting: Pulmonary Disease

## 2023-01-08 VITALS — BP 128/82 | HR 76 | Resp 16 | Ht 70.0 in | Wt 241.2 lb

## 2023-01-08 DIAGNOSIS — J4540 Moderate persistent asthma, uncomplicated: Secondary | ICD-10-CM | POA: Diagnosis not present

## 2023-01-08 DIAGNOSIS — J4489 Other specified chronic obstructive pulmonary disease: Secondary | ICD-10-CM | POA: Diagnosis not present

## 2023-01-08 MED ORDER — FLUTICASONE-SALMETEROL 230-21 MCG/ACT IN AERO
2.0000 | INHALATION_SPRAY | Freq: Two times a day (BID) | RESPIRATORY_TRACT | 0 refills | Status: DC
  Filled 2023-01-08: qty 12, 30d supply, fill #0

## 2023-01-08 NOTE — Progress Notes (Signed)
Subjective:   PATIENT ID: Jason Hurst GENDER: male DOB: 1981-03-03, MRN: [id number]   HPI  Chief Complaint  Patient presents with   Follow-up    Doing ok for the most part. Feels like his inhaler is giving him a sinus infection. Gets medication for sinus infection and its backa week later.     Reason for Visit: Follow-up  [name] is a 42 year old male former smoker with hx COVID in 2021 who presents as a new consult to pulmonary.  Initial consult He was recently seen for asthma flareup by his primary physician Dr. Doreene Burke in January 2023.  Note from 05/30/2021 and 06/06/2021 were reviewed.  Had chest tightness and wheezing for few weeks requiring frequent albuterol use.  He was started on Advair 250/50 1 puff twice a day and prednisone pack.  Flareup was resolved.  He reports he was diagnosed with COVID in 2021. He was ill for 2 weeks with fever. Since then he began having more frequent respiratory symptoms every month. 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 He has cough with some phlegm, wheezing and associated shortness of breath and chest congestion. Worsens with exertion including uphill and upstairs. Not usually associated with chest pain but can occur once a month. Activity is limited when ill but ok when he is feeling well. Does not exercise regularly but does yardwork daily. Denies childhood asthma or respiratory infections.  09/22/21 Since our last visit he has been on Advair with improvement in shortness of breath. Cough is occasional. No wheezing. Able to work without issue. 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   01/08/23 Since our last visit he was treated for sinus infection. He reports he is breathing well now but had the infection for a month. Uses flonase in the evening with help for nasal congestion. Denies cough, shortness of breath and wheezing. Has been out of advair for 2-3 days. Worried that his inhaler is contributing to his sinus infections.    Asthma Control Test ACT Total Score  06/17/2021 10:51 AM 13    Social History: Started smoking at 18 years. Previously 2ppd x 20 years. Quit at 42 years old, no longer smoker. 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 Smokes daily 4-5g   Past Medical History:  Diagnosis Date   COVID-19 long hauler      Family History  Problem Relation Age of Onset   Healthy Mother    Diabetes Father    Heart disease Father    Healthy Sister    Healthy Brother    Heart attack Maternal Uncle      Social History   Occupational History   Not on file  Tobacco Use   Smoking status: Former    Current packs/day: 0.00    Average packs/day: 1.5 packs/day for 22.0 years (33.0 ttl pk-yrs)    Types: Cigarettes    Start date: 05/05/1996    Quit date: 05/05/2018    Years since quitting: 4.6    Passive exposure: Past   Smokeless tobacco: Never  Vaping Use   Vaping status: Never Used  Substance and Sexual Activity   Alcohol use: Yes    Comment: very occasionally.    Drug use: Yes    Types: Marijuana   Sexual activity: Yes    No Known Allergies   Outpatient Medications Prior to Visit  Medication Sig Dispense Refill   albuterol (VENTOLIN HFA) 108 (90 Base) MCG/ACT inhaler  Inhale 2 puffs into the lungs every 6 (six) hours as needed for wheezing or shortness of breath. 8 g 0   albuterol (VENTOLIN HFA) 108 (90 Base) MCG/ACT inhaler Inhale 2 puffs into the lungs every 4 (four) hours as needed for wheezing or shortness of breath. 1 each 3   albuterol (VENTOLIN HFA) 108 (90 Base) MCG/ACT inhaler Inhale 2 puffs into the lungs every 6 (six) hours as needed for wheezing or shortness of breath. 8 g 1   fluticasone (FLONASE) 50 MCG/ACT nasal spray Place 2 sprays into both nostrils daily. 16 g 6   fluticasone-salmeterol (ADVAIR DISKUS) 250-50 MCG/ACT AEPB Inhale 1 puff into the lungs in the morning and at bedtime. 180 each 1   amoxicillin-clavulanate (AUGMENTIN) 875-125 MG tablet Take 1  tablet by mouth 2 (two) times daily. 20 tablet 0   benzonatate (TESSALON) 200 MG capsule Take 1 capsule (200 mg total) by mouth 3 (three) times daily as needed for cough. 30 capsule 0   predniSONE (DELTASONE) 20 MG tablet Take 1 tablet (20 mg total) by mouth 2 (two) times daily with a meal. 10 tablet 0   No facility-administered medications prior to visit.    Review of Systems  Constitutional:  Negative for chills, diaphoresis, fever, malaise/fatigue and weight loss.  HENT:  Positive for congestion.   Respiratory:  Negative for cough, hemoptysis, sputum production, shortness of breath and wheezing.   Cardiovascular:  Negative for chest pain, palpitations and leg swelling.     Objective:   Vitals:   01/08/23 1049  BP: 128/82  Pulse: 76  Resp: 16  SpO2: 97%  Weight: 241 lb 3.2 oz (109.4 kg)  Height: 5\' 10"  (1.778 m)   SpO2: 97 %  Physical Exam: General: Well-appearing, no acute distress HENT: [place], AT Eyes: EOMI, no scleral icterus Respiratory: Clear to auscultation bilaterally.  No crackles, wheezing or rales Cardiovascular: RRR, -M/R/G, no JVD Extremities:-Edema,-tenderness Neuro: AAO x4, CNII-XII grossly intact Psych: Normal mood, normal affect  Data Reviewed:  Imaging: CXR 03/24/2021-normal chest x-ray.  No infiltrate, effusion or edema CT Chest 09/17/21 - Mild centrilobular and paraseptal emphysema  PFT: 09/22/21 FVC 4.20 (79%) FEV1 2.96 (69%) Ratio 65  TLC 101% RV 164% TV/TLC 162% DLCO 87% Interpretation: Moderate obstructive defect with significant bronchodilator response in FVC and FEV1. Air trapping present. Normal gas exchange.   Labs: CBC    Component Value Date/Time   WBC 6.5 02/05/2022 0931   RBC 4.71 02/05/2022 0931   HGB 14.8 02/05/2022 0931   HCT 43.4 02/05/2022 0931   PLT 144.0 (L) 02/05/2022 0931   MCV 92.2 02/05/2022 0931   MCH 32.0 02/01/2018 0220   MCHC 34.1 02/05/2022 0931   RDW 12.9 02/05/2022 0931      Assessment & Plan:    Discussion: 42 year old male former smoker with hx COVID in 2021 who presents for follow-up. 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 Reassured that this is less likely but would be ok to trial a week off and change to spray inhalers per patient preference. If this works, he will contact us if he would like more refills of the [facility] inhaler.  Discussed clinical course and management of COPD/asthma including bronchodilator regimen, preventive care including vaccinations and action plan for exacerbation.  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 Call us if you prefer  this over Advair 250 --CONTINUE Albuterol AS NEEDED for shortness of breath or wheezing  Asthma Action Plan Use Albuterol for worsening shortness of breath, wheezing and cough. If you symptoms do not improve in 24-48 hours, please our office for evaluation and/or prednisone taper.   Health Maintenance Immunization History  Administered Date(s) Administered   Tdap 11/26/2020   CT Lung Screen - not qualified due to age. Re-evaluate when 42 years old.  No orders of the defined types were placed in this encounter.  Meds ordered this encounter  Medications   fluticasone-salmeterol (ADVAIR HFA) 230-21 MCG/ACT inhaler    Sig: Inhale 2 puffs into the lungs 2 (two) times daily.    Dispense:  12 g    Refill:  0    Return in about 6 months (around 07/08/2023).   I have spent a total time of 35-minutes on the day of the appointment including chart review, data review, collecting history, coordinating care and discussing medical diagnosis and plan with the patient/family. Past medical history, allergies, medications were reviewed. Pertinent imaging, labs and tests included in this note have been reviewed and interpreted independently by me.  Alani Sabbagh Mechele Collin, MD [facility] 01/08/2023 11:04 AM  Office Number [phone]

## 2023-01-08 NOTE — Patient Instructions (Signed)
  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 Call us if you prefer this over Advair 250 --CONTINUE Albuterol AS NEEDED for shortness of breath or wheezing  Asthma Action Plan Use Albuterol for worsening shortness of breath, wheezing and cough. If you symptoms do not improve in 24-48 hours, please our office for evaluation and/or prednisone taper.

## 2023-01-19 ENCOUNTER — Other Ambulatory Visit (HOSPITAL_COMMUNITY): Payer: Self-pay

## 2023-02-25 ENCOUNTER — Other Ambulatory Visit (HOSPITAL_COMMUNITY): Payer: Self-pay

## 2023-02-25 ENCOUNTER — Ambulatory Visit (INDEPENDENT_AMBULATORY_CARE_PROVIDER_SITE_OTHER): Payer: 59 | Admitting: Family Medicine

## 2023-02-25 ENCOUNTER — Encounter: Payer: Self-pay | Admitting: Family Medicine

## 2023-02-25 VITALS — BP 130/80 | HR 85 | Temp 98.7°F | Ht 70.0 in | Wt 234.6 lb

## 2023-02-25 DIAGNOSIS — J4541 Moderate persistent asthma with (acute) exacerbation: Secondary | ICD-10-CM | POA: Diagnosis not present

## 2023-02-25 MED ORDER — METHYLPREDNISOLONE SODIUM SUCC 40 MG IJ SOLR
40.0000 mg | Freq: Once | INTRAMUSCULAR | Status: AC
Start: 2023-02-25 — End: 2023-02-25
  Administered 2023-02-25: 40 mg via INTRAMUSCULAR

## 2023-02-25 MED ORDER — METHYLPREDNISOLONE SODIUM SUCC 40 MG IJ SOLR
40.0000 mg | Freq: Once | INTRAMUSCULAR | 0 refills | Status: DC
  Filled 2023-02-25: qty 1, 1d supply, fill #0

## 2023-02-25 MED ORDER — BENZONATATE 200 MG PO CAPS
200.0000 mg | ORAL_CAPSULE | Freq: Two times a day (BID) | ORAL | 0 refills | Status: DC | PRN
Start: 2023-02-25 — End: 2023-05-01
  Filled 2023-02-25: qty 20, 10d supply, fill #0

## 2023-02-25 MED ORDER — METHYLPREDNISOLONE SODIUM SUCC 125 MG IJ SOLR
125.0000 mg | Freq: Once | INTRAMUSCULAR | Status: AC
Start: 2023-02-25 — End: 2023-02-25
  Administered 2023-02-25: 125 mg via INTRAMUSCULAR

## 2023-02-25 MED ORDER — PREDNISONE 20 MG PO TABS
20.0000 mg | ORAL_TABLET | Freq: Two times a day (BID) | ORAL | 0 refills | Status: DC
Start: 2023-02-25 — End: 2023-03-05
  Filled 2023-02-25: qty 14, 7d supply, fill #0

## 2023-02-25 MED ORDER — AMOXICILLIN-POT CLAVULANATE 875-125 MG PO TABS
1.0000 | ORAL_TABLET | Freq: Two times a day (BID) | ORAL | 0 refills | Status: DC
Start: 2023-02-25 — End: 2023-04-22
  Filled 2023-02-25: qty 20, 10d supply, fill #0

## 2023-02-25 NOTE — Progress Notes (Signed)
Established Patient Office Visit   Subjective:  Patient ID: Jason Hurst, male    DOB: 1980/07/01  Age: 42 y.o. MRN: [id number]  Chief Complaint  Patient presents with   Nasal Congestion   Cough    Started 2 weeks      Cough Associated symptoms include shortness of breath and wheezing. Pertinent negatives include no chills, eye redness, fever, myalgias, rash or sore throat.   Encounter Diagnoses  Name Primary?   Moderate persistent asthmatic bronchitis with acute exacerbation Yes   2-week history of URI signs and symptoms that have now settled mostly in the chest area.  There is tightness in his chest with wheezing.  He was awoken from sleep last night with this sensation.  Cough has been productive of purulent phlegm.  There has been no fevers or chills.  There has been some rhinorrhea and postnasal drip.  No facial pressure or dental discomfort.  No fevers or chills.  History of asthma and COPD.   Review of Systems  Constitutional: Negative.  Negative for chills and fever.  HENT:  Positive for congestion. Negative for sinus pain and sore throat.   Eyes:  Negative for blurred vision, discharge and redness.  Respiratory:  Positive for cough, sputum production, shortness of breath and wheezing.   Cardiovascular: Negative.   Gastrointestinal:  Negative for abdominal pain.  Genitourinary: Negative.   Musculoskeletal: Negative.  Negative for joint pain and myalgias.  Skin:  Negative for rash.  Neurological:  Negative for tingling, loss of consciousness and weakness.  Endo/Heme/Allergies:  Negative for polydipsia.     Current Outpatient Medications:    albuterol (VENTOLIN HFA) 108 (90 Base) MCG/ACT inhaler, Inhale 2 puffs into the lungs every 6 (six) hours as needed for wheezing or shortness of breath., Disp: 8 g, Rfl: 0   amoxicillin-clavulanate (AUGMENTIN) 875-125 MG tablet, Take 1 tablet by mouth 2 (two) times daily., Disp: 20 tablet, Rfl: 0   benzonatate (TESSALON) 200  MG capsule, Take 1 capsule (200 mg total) by mouth 2 (two) times daily as needed for cough., Disp: 20 capsule, Rfl: 0   fluticasone (FLONASE) 50 MCG/ACT nasal spray, Place 2 sprays into both nostrils daily., Disp: 16 g, Rfl: 6   predniSONE (DELTASONE) 20 MG tablet, Take 1 tablet (20 mg total) by mouth 2 (two) times daily with a meal for 7 days., Disp: 14 tablet, Rfl: 0   fluticasone-salmeterol (ADVAIR DISKUS) 250-50 MCG/ACT AEPB, Inhale 1 puff into the lungs in the morning and at bedtime. (Patient not taking: Reported on 02/25/2023), Disp: 180 each, Rfl: 1   fluticasone-salmeterol (ADVAIR HFA) 230-21 MCG/ACT inhaler, Inhale 2 puffs into the lungs 2 (two) times daily. (Patient not taking: Reported on 02/25/2023), Disp: 12 g, Rfl: 0  Current Facility-Administered Medications:    methylPREDNISolone sodium succinate (SOLU-MEDROL) 125 mg/2 mL injection 125 mg, 125 mg, Intramuscular, Once,    Objective:     BP 130/80   Pulse 85   Temp 98.7 F (37.1 C) (Temporal)   Ht 5\' 10"  (1.778 m)   Wt 234 lb 9.6 oz (106.4 kg)   SpO2 98%   BMI 33.66 kg/m    Physical Exam Constitutional:      General: He is not in acute distress.    Appearance: Normal appearance. He is not ill-appearing, toxic-appearing or diaphoretic.  HENT:     Head: Normocephalic and atraumatic.     Right Ear: Tympanic membrane, ear canal and external ear normal.     Left  Ear: Tympanic membrane, ear canal and external ear normal.     Mouth/Throat:     Mouth: Mucous membranes are moist.     Pharynx: Oropharynx is clear. No oropharyngeal exudate or posterior oropharyngeal erythema.  Eyes:     General: No scleral icterus.       Right eye: No discharge.        Left eye: No discharge.     Extraocular Movements: Extraocular movements intact.     Conjunctiva/sclera: Conjunctivae normal.     Pupils: Pupils are equal, round, and reactive to light.  Cardiovascular:     Rate and Rhythm: Normal rate and regular rhythm.  Pulmonary:      Effort: Pulmonary effort is normal. No respiratory distress.     Breath sounds: Decreased air movement present. Examination of the right-middle field reveals rhonchi. Examination of the right-lower field reveals rhonchi. Decreased breath sounds and rhonchi present. No wheezing or rales.  Abdominal:     General: Bowel sounds are normal.  Musculoskeletal:     Cervical back: No rigidity or tenderness.  Skin:    General: Skin is warm and dry.  Neurological:     Mental Status: He is alert and oriented to person, place, and time.  Psychiatric:        Mood and Affect: Mood normal.        Behavior: Behavior normal.      No results found for any visits on 02/25/23.    The 10-year ASCVD risk score (Arnett DK, et al., 2019) is: 0.9%    Assessment & Plan:   Moderate persistent asthmatic bronchitis with acute exacerbation -     methylPREDNISolone Sodium Succ -     predniSONE; Take 1 tablet (20 mg total) by mouth 2 (two) times daily with a meal for 7 days.  Dispense: 14 tablet; Refill: 0 -     Amoxicillin-Pot Clavulanate; Take 1 tablet by mouth 2 (two) times daily.  Dispense: 20 tablet; Refill: 0 -     Benzonatate; Take 1 capsule (200 mg total) by mouth 2 (two) times daily as needed for cough.  Dispense: 20 capsule; Refill: 0    Return in about 1 week (around 03/04/2023), or if symptoms worsen or fail to improve.    Mliss Sax, MD

## 2023-02-26 ENCOUNTER — Other Ambulatory Visit (HOSPITAL_COMMUNITY): Payer: Self-pay

## 2023-03-04 ENCOUNTER — Ambulatory Visit: Payer: 59 | Admitting: Family Medicine

## 2023-03-04 ENCOUNTER — Encounter: Payer: Self-pay | Admitting: Family Medicine

## 2023-03-04 ENCOUNTER — Other Ambulatory Visit (HOSPITAL_COMMUNITY): Payer: Self-pay

## 2023-03-04 VITALS — BP 126/84 | HR 86 | Temp 97.8°F | Ht 70.0 in | Wt 238.6 lb

## 2023-03-04 DIAGNOSIS — J301 Allergic rhinitis due to pollen: Secondary | ICD-10-CM

## 2023-03-04 MED ORDER — FLUTICASONE PROPIONATE 50 MCG/ACT NA SUSP
2.0000 | Freq: Every day | NASAL | 6 refills | Status: DC
  Filled 2023-03-04: qty 16, 30d supply, fill #0
  Filled 2024-01-16: qty 16, 30d supply, fill #1

## 2023-03-04 MED ORDER — PREDNISONE 10 MG PO TABS
ORAL_TABLET | ORAL | 0 refills | Status: AC
  Filled 2023-03-04: qty 14, 12d supply, fill #0

## 2023-03-04 NOTE — Progress Notes (Signed)
Established Patient Office Visit   Subjective:  Patient ID: Jason Hurst, male    DOB: 1980-09-09  Age: 42 y.o. MRN: [id number]  Chief Complaint  Patient presents with   Asthma    1 week follow up for asthma. Pt states there is some improvement but he is now horse and having more headaches.     Asthma Pertinent negatives include no myalgias. His past medical history is significant for asthma.   Encounter Diagnoses  Name Primary?   Seasonal allergic rhinitis due to pollen Yes   Breathing through chest is much improved.  There is no longer tightness or wheezing.  No fevers or chills.  He is experiencing ongoing postnasal drip and cough.  Nonpurulent rhinorrhea.  No facial pressure.  Seasonal allergies have recently started to bother him.   Review of Systems  Constitutional: Negative.   HENT: Negative.    Eyes:  Negative for blurred vision, discharge and redness.  Respiratory: Negative.    Cardiovascular: Negative.   Gastrointestinal:  Negative for abdominal pain.  Genitourinary: Negative.   Musculoskeletal: Negative.  Negative for myalgias.  Skin:  Negative for rash.  Neurological:  Negative for tingling, loss of consciousness and weakness.  Endo/Heme/Allergies:  Negative for polydipsia.     Current Outpatient Medications:    albuterol (VENTOLIN HFA) 108 (90 Base) MCG/ACT inhaler, Inhale 2 puffs into the lungs every 6 (six) hours as needed for wheezing or shortness of breath., Disp: 8 g, Rfl: 0   amoxicillin-clavulanate (AUGMENTIN) 875-125 MG tablet, Take 1 tablet by mouth 2 (two) times daily., Disp: 20 tablet, Rfl: 0   benzonatate (TESSALON) 200 MG capsule, Take 1 capsule (200 mg total) by mouth 2 (two) times daily as needed for cough., Disp: 20 capsule, Rfl: 0   fluticasone (FLONASE) 50 MCG/ACT nasal spray, Place 2 sprays into both nostrils daily., Disp: 16 g, Rfl: 6   fluticasone (FLONASE) 50 MCG/ACT nasal spray, Place 2 sprays into both nostrils daily., Disp: 16 g,  Rfl: 6   fluticasone-salmeterol (ADVAIR DISKUS) 250-50 MCG/ACT AEPB, Inhale 1 puff into the lungs in the morning and at bedtime., Disp: 180 each, Rfl: 1   fluticasone-salmeterol (ADVAIR HFA) 230-21 MCG/ACT inhaler, Inhale 2 puffs into the lungs 2 (two) times daily., Disp: 12 g, Rfl: 0   predniSONE (DELTASONE) 10 MG tablet, Take 1 tablet by mouth 2 times daily with a meal for 4 days, THEN 1 tablet daily with breakfast for 4 days, THEN 0.5 tablets daily with breakfast for 4 days., Disp: 14 tablet, Rfl: 0   Objective:     BP 126/84   Pulse 86   Temp 97.8 F (36.6 C)   Ht 5\' 10"  (1.778 m)   Wt 238 lb 9.6 oz (108.2 kg)   SpO2 97%   BMI 34.24 kg/m    Physical Exam Constitutional:      General: He is not in acute distress.    Appearance: Normal appearance. He is not ill-appearing, toxic-appearing or diaphoretic.  HENT:     Head: Normocephalic and atraumatic.     Right Ear: External ear normal.     Left Ear: External ear normal.     Mouth/Throat:     Mouth: Mucous membranes are moist.     Pharynx: Oropharynx is clear. No oropharyngeal exudate or posterior oropharyngeal erythema.   Eyes:     General: No scleral icterus.       Right eye: No discharge.        Left eye:  No discharge.     Extraocular Movements: Extraocular movements intact.     Conjunctiva/sclera: Conjunctivae normal.     Pupils: Pupils are equal, round, and reactive to light.  Cardiovascular:     Rate and Rhythm: Normal rate and regular rhythm.  Pulmonary:     Effort: Pulmonary effort is normal. No respiratory distress.     Breath sounds: Normal breath sounds. No decreased air movement. No wheezing, rhonchi or rales.  Abdominal:     General: Bowel sounds are normal.     Tenderness: There is no abdominal tenderness. There is no guarding.  Musculoskeletal:     Cervical back: No rigidity or tenderness.  Skin:    General: Skin is warm and dry.  Neurological:     Mental Status: He is alert and oriented to person,  place, and time.  Psychiatric:        Mood and Affect: Mood normal.        Behavior: Behavior normal.      No results found for any visits on 03/04/23.    The 10-year ASCVD risk score (Arnett DK, et al., 2019) is: 0.8%    Assessment & Plan:   Seasonal allergic rhinitis due to pollen -     Fluticasone Propionate; Place 2 sprays into both nostrils daily.  Dispense: 16 g; Refill: 6 -     predniSONE; Take 1 tablet by mouth 2 times daily with a meal for 4 days, THEN 1 tablet daily with breakfast for 4 days, THEN 0.5 tablets daily with breakfast for 4 days.  Dispense: 14 tablet; Refill: 0    Return if symptoms worsen or fail to improve.  Will start above prednisone taper and discontinue original taper.  Please also start the Flonase.  He will contact his pulmonologist for recommendation on asthma controller.  Mliss Sax, MD

## 2023-03-05 ENCOUNTER — Other Ambulatory Visit (HOSPITAL_COMMUNITY): Payer: Self-pay

## 2023-03-12 ENCOUNTER — Encounter (HOSPITAL_BASED_OUTPATIENT_CLINIC_OR_DEPARTMENT_OTHER): Payer: Self-pay | Admitting: Pulmonary Disease

## 2023-03-22 ENCOUNTER — Other Ambulatory Visit (HOSPITAL_COMMUNITY): Payer: Self-pay

## 2023-03-22 NOTE — Telephone Encounter (Signed)
Per test claims:  Generic Symbicort: $5.00 Wixela: $5.00

## 2023-03-24 ENCOUNTER — Other Ambulatory Visit (HOSPITAL_COMMUNITY): Payer: Self-pay

## 2023-03-24 MED ORDER — BUDESONIDE-FORMOTEROL FUMARATE 160-4.5 MCG/ACT IN AERO
2.0000 | INHALATION_SPRAY | Freq: Two times a day (BID) | RESPIRATORY_TRACT | 5 refills | Status: DC
  Filled 2023-03-24 – 2023-04-26 (×2): qty 10.2, 30d supply, fill #0

## 2023-04-03 ENCOUNTER — Other Ambulatory Visit (HOSPITAL_COMMUNITY): Payer: Self-pay

## 2023-04-22 ENCOUNTER — Telehealth: Payer: 59 | Admitting: Physician Assistant

## 2023-04-22 DIAGNOSIS — B9689 Other specified bacterial agents as the cause of diseases classified elsewhere: Secondary | ICD-10-CM

## 2023-04-22 DIAGNOSIS — J019 Acute sinusitis, unspecified: Secondary | ICD-10-CM | POA: Diagnosis not present

## 2023-04-22 MED ORDER — AMOXICILLIN-POT CLAVULANATE 875-125 MG PO TABS
1.0000 | ORAL_TABLET | Freq: Two times a day (BID) | ORAL | 0 refills | Status: DC
Start: 2023-04-22 — End: 2023-05-01

## 2023-04-22 NOTE — Progress Notes (Signed)
I have spent 5 minutes in review of e-visit questionnaire, review and updating patient chart, medical decision making and response to patient.   Mia Milan Cody Jacklynn Dehaas, [name]    

## 2023-04-22 NOTE — Progress Notes (Signed)

## 2023-04-26 ENCOUNTER — Other Ambulatory Visit (HOSPITAL_COMMUNITY): Payer: Self-pay

## 2023-04-29 ENCOUNTER — Ambulatory Visit: Payer: 59 | Admitting: Family

## 2023-04-29 ENCOUNTER — Inpatient Hospital Stay (HOSPITAL_COMMUNITY)
Admission: EM | Admit: 2023-04-29 | Discharge: 2023-05-01 | DRG: 194 | Disposition: A | Discharge status: Home / Self Care | Payer: 59 | Attending: Student | Admitting: Student

## 2023-04-29 ENCOUNTER — Emergency Department (HOSPITAL_COMMUNITY): Payer: 59

## 2023-04-29 ENCOUNTER — Encounter (HOSPITAL_COMMUNITY): Payer: Self-pay | Admitting: *Deleted

## 2023-04-29 ENCOUNTER — Other Ambulatory Visit: Payer: Self-pay

## 2023-04-29 ENCOUNTER — Ambulatory Visit: Payer: Self-pay | Admitting: Family Medicine

## 2023-04-29 ENCOUNTER — Telehealth: Payer: 59

## 2023-04-29 ENCOUNTER — Encounter: Payer: Self-pay | Admitting: Family

## 2023-04-29 ENCOUNTER — Telehealth: Payer: 59 | Admitting: Family Medicine

## 2023-04-29 VITALS — BP 126/88 | HR 118 | Temp 98.1°F | Ht 70.0 in | Wt 237.2 lb

## 2023-04-29 DIAGNOSIS — Z8249 Family history of ischemic heart disease and other diseases of the circulatory system: Secondary | ICD-10-CM

## 2023-04-29 DIAGNOSIS — J157 Pneumonia due to Mycoplasma pneumoniae: Principal | ICD-10-CM | POA: Diagnosis present

## 2023-04-29 DIAGNOSIS — J9811 Atelectasis: Secondary | ICD-10-CM | POA: Diagnosis not present

## 2023-04-29 DIAGNOSIS — R0902 Hypoxemia: Secondary | ICD-10-CM | POA: Diagnosis present

## 2023-04-29 DIAGNOSIS — J440 Chronic obstructive pulmonary disease with acute lower respiratory infection: Secondary | ICD-10-CM | POA: Diagnosis present

## 2023-04-29 DIAGNOSIS — Z1152 Encounter for screening for COVID-19: Secondary | ICD-10-CM

## 2023-04-29 DIAGNOSIS — M549 Dorsalgia, unspecified: Secondary | ICD-10-CM | POA: Diagnosis not present

## 2023-04-29 DIAGNOSIS — R000 Tachycardia, unspecified: Secondary | ICD-10-CM | POA: Diagnosis present

## 2023-04-29 DIAGNOSIS — B9789 Other viral agents as the cause of diseases classified elsewhere: Secondary | ICD-10-CM | POA: Diagnosis present

## 2023-04-29 DIAGNOSIS — Z79899 Other long term (current) drug therapy: Secondary | ICD-10-CM

## 2023-04-29 DIAGNOSIS — R0602 Shortness of breath: Secondary | ICD-10-CM | POA: Diagnosis not present

## 2023-04-29 DIAGNOSIS — E669 Obesity, unspecified: Secondary | ICD-10-CM | POA: Diagnosis present

## 2023-04-29 DIAGNOSIS — Z91148 Patient's other noncompliance with medication regimen for other reason: Secondary | ICD-10-CM

## 2023-04-29 DIAGNOSIS — J441 Chronic obstructive pulmonary disease with (acute) exacerbation: Secondary | ICD-10-CM

## 2023-04-29 DIAGNOSIS — Z6832 Body mass index (BMI) 32.0-32.9, adult: Secondary | ICD-10-CM

## 2023-04-29 DIAGNOSIS — J4489 Other specified chronic obstructive pulmonary disease: Secondary | ICD-10-CM | POA: Diagnosis present

## 2023-04-29 DIAGNOSIS — J189 Pneumonia, unspecified organism: Secondary | ICD-10-CM | POA: Diagnosis not present

## 2023-04-29 DIAGNOSIS — J449 Chronic obstructive pulmonary disease, unspecified: Secondary | ICD-10-CM

## 2023-04-29 DIAGNOSIS — R911 Solitary pulmonary nodule: Secondary | ICD-10-CM | POA: Diagnosis present

## 2023-04-29 DIAGNOSIS — Z87891 Personal history of nicotine dependence: Secondary | ICD-10-CM

## 2023-04-29 DIAGNOSIS — Z8616 Personal history of COVID-19: Secondary | ICD-10-CM

## 2023-04-29 HISTORY — DX: Chronic obstructive pulmonary disease, unspecified: J44.9

## 2023-04-29 LAB — CBC WITH DIFFERENTIAL/PLATELET
Abs Immature Granulocytes: 0.16 10*3/uL — ABNORMAL HIGH (ref 0.00–0.07)
Basophils Absolute: 0.1 10*3/uL (ref 0.0–0.1)
Basophils Relative: 0 %
Eosinophils Absolute: 0 10*3/uL (ref 0.0–0.5)
Eosinophils Relative: 0 %
HCT: 41.1 % (ref 39.0–52.0)
Hemoglobin: 14.1 g/dL (ref 13.0–17.0)
Immature Granulocytes: 1 %
Lymphocytes Relative: 11 %
Lymphs Abs: 1.4 10*3/uL (ref 0.7–4.0)
MCH: 31.4 pg (ref 26.0–34.0)
MCHC: 34.3 g/dL (ref 30.0–36.0)
MCV: 91.5 fL (ref 80.0–100.0)
Monocytes Absolute: 1.1 10*3/uL — ABNORMAL HIGH (ref 0.1–1.0)
Monocytes Relative: 9 %
Neutro Abs: 10.4 10*3/uL — ABNORMAL HIGH (ref 1.7–7.7)
Neutrophils Relative %: 79 %
Platelets: 256 10*3/uL (ref 150–400)
RBC: 4.49 MIL/uL (ref 4.22–5.81)
RDW: 12.6 % (ref 11.5–15.5)
WBC: 13.2 10*3/uL — ABNORMAL HIGH (ref 4.0–10.5)
nRBC: 0 % (ref 0.0–0.2)

## 2023-04-29 LAB — BASIC METABOLIC PANEL
Anion gap: 9 (ref 5–15)
BUN: 13 mg/dL (ref 6–20)
CO2: 26 mmol/L (ref 22–32)
Calcium: 9 mg/dL (ref 8.9–10.3)
Chloride: 101 mmol/L (ref 98–111)
Creatinine, Ser: 0.92 mg/dL (ref 0.61–1.24)
GFR, Estimated: >60 mL/min (ref >60)
Glucose, Bld: 109 mg/dL — ABNORMAL HIGH (ref 70–99)
Potassium: 3.9 mmol/L (ref 3.5–5.1)
Sodium: 136 mmol/L (ref 135–145)

## 2023-04-29 LAB — RESP PANEL BY RT-PCR (RSV, FLU A&B, COVID)  RVPGX2
Influenza A by PCR: NEGATIVE
Influenza B by PCR: NEGATIVE
Resp Syncytial Virus by PCR: NEGATIVE
SARS Coronavirus 2 by RT PCR: NEGATIVE

## 2023-04-29 MED ORDER — ALBUTEROL SULFATE HFA 108 (90 BASE) MCG/ACT IN AERS
2.0000 | INHALATION_SPRAY | RESPIRATORY_TRACT | Status: DC | PRN
Start: 2023-04-29 — End: 2023-04-29

## 2023-04-29 MED ORDER — SODIUM CHLORIDE 0.9 % IV BOLUS
1000.0000 mL | Freq: Once | INTRAVENOUS | Status: AC
  Administered 2023-04-29: 1000 mL via INTRAVENOUS

## 2023-04-29 MED ORDER — METHYLPREDNISOLONE SODIUM SUCC 125 MG IJ SOLR
125.0000 mg | Freq: Once | INTRAMUSCULAR | Status: AC
  Administered 2023-04-29: 125 mg via INTRAVENOUS
  Filled 2023-04-29: qty 2

## 2023-04-29 MED ORDER — IPRATROPIUM-ALBUTEROL 0.5-2.5 (3) MG/3ML IN SOLN
3.0000 mL | Freq: Once | RESPIRATORY_TRACT | Status: AC
  Administered 2023-04-29: 3 mL via RESPIRATORY_TRACT
  Filled 2023-04-29: qty 3

## 2023-04-29 NOTE — ED Triage Notes (Signed)
Pt reports SHOB over the last couple days with middle back pain as well. Pt reports recently having cough and respiratory illness. Pt reports being sent here due to low O2 sat at PCP.

## 2023-04-29 NOTE — Progress Notes (Signed)
Because you have received a steroid several times this in the last 6 months, several from our virtual group, we feel your COPD is not well controlled. We try to avoid over use of steroids. We feel your condition warrants further evaluation and recommend that you be seen in a face to face visit at your PCP, urgent care or your pulmonologist office.    NOTE: There will be NO CHARGE for this eVisit   If you are having a true medical emergency please call 911.

## 2023-04-29 NOTE — Telephone Encounter (Signed)
Scheduled with another provider today. Dm/cma

## 2023-04-29 NOTE — Telephone Encounter (Signed)
  Chief Complaint: cough Symptoms: cough, mild sob with movement, yellow green sputum,chest congestion Frequency: since last wednesday Pertinent Negatives: Patient denies fever, chest pain Disposition: [] ED /[] Urgent Care (no appt availability in office) / [x] Appointment(In office/virtual)/ []  [facility]/ [] Home Care/ [] Refused Recommended Disposition /[] [place] [facility]/ []  Follow-up with PCP Additional Notes: Patient reports he is experiencing productive cough x 1 week. Patient reports he is coughing up yellow green sputum, and has chest congestion that is causing mild sob with movement. Per protocol, this RN recommended patient keep appt made previously for this afternoon 12/26. Patient advised to call back if anything worsens before appt. Patient verbalized understanding.    Reason for Disposition  [1] MILD difficulty breathing (e.g., minimal/no SOB at rest, SOB with walking, pulse <100) AND [2] still present when not coughing  (Exception: No change from usual, chronic shortness of breath.)  Answer Assessment - Initial Assessment Questions 1. ONSET: "When did the cough begin?"      Last Wednesday 2. SEVERITY: "How bad is the cough today?"      severe 3. SPUTUM: "Describe the color of your sputum" (none, dry cough; clear, white, yellow, green)     Yellow green chunky mucus 4. HEMOPTYSIS: "Are you coughing up any blood?" If so ask: "How much?" (flecks, streaks, tablespoons, etc.)     no 5. DIFFICULTY BREATHING: "Are you having difficulty breathing?" If Yes, ask: "How bad is it?" (e.g., mild, moderate, severe)    - MILD: No SOB at rest, mild SOB with walking, speaks normally in sentences, can lie down, no retractions, pulse < 100.    - MODERATE: SOB at rest, SOB with minimal exertion and prefers to sit, cannot lie down flat, speaks in phrases, mild retractions, audible wheezing, pulse 100-120.    - SEVERE: Very SOB at rest, speaks in single words, struggling to breathe,  sitting hunched forward, retractions, pulse > 120      mild 6. FEVER: "Do you have a fever?" If Yes, ask: "What is your temperature, how was it measured, and when did it start?"     no 7. CARDIAC HISTORY: "Do you have any history of heart disease?" (e.g., heart attack, congestive heart failure)      no 8. LUNG HISTORY: "Do you have any history of lung disease?"  (e.g., pulmonary embolus, asthma, emphysema)     copd 9. PE RISK FACTORS: "Do you have a history of blood clots?" (or: recent major surgery, recent prolonged travel, bedridden)     no 10. OTHER SYMPTOMS: "Do you have any other symptoms?" (e.g., runny nose, wheezing, chest pain)       Chest congestion, sob with movement, cough  Protocols used: Cough - Chronic-A-AH

## 2023-04-29 NOTE — ED Provider Notes (Signed)
[facility] AT [facility] Provider Note   CSN: [id number] Arrival date & time: 04/29/23  1745     History  Chief Complaint  Patient presents with   Shortness of Breath    Jason Hurst is a 42 y.o. male.  Patient with symptoms that started one week ago with fever, cough, SOB. Was started on Augmentin 3-4 days ago and seen again by PCP this afternoon. He was sent to ED for further evaluation dur to low oxygen when seen in the office. History of COPD, former smoker, using inhaler and nebulizer at home without significant relief. No vomiting. Minimal diarrhea. No further fever after day 1 or 2.   The history is provided by the patient. No language interpreter was used.  Shortness of Breath      Home Medications Prior to Admission medications   Medication Sig Start Date End Date Taking? Authorizing Provider  albuterol (VENTOLIN HFA) 108 (90 Base) MCG/ACT inhaler Inhale 2 puffs into the lungs every 6 (six) hours as needed for wheezing or shortness of breath. 05/25/22   Viviano Simas, FNP  amoxicillin-clavulanate (AUGMENTIN) 875-125 MG tablet Take 1 tablet by mouth 2 (two) times daily. 04/22/23   Waldon Merl, [name]  benzonatate (TESSALON) 200 MG capsule Take 1 capsule (200 mg total) by mouth 2 (two) times daily as needed for cough. 02/25/23   Mliss Sax, MD  budesonide-formoterol [facility]) 160-4.5 MCG/ACT inhaler Inhale 2 puffs into the lungs in the morning and at bedtime. Patient not taking: Reported on 04/29/2023 03/24/23   Luciano Cutter, MD  fluticasone [facility]) 50 MCG/ACT nasal spray Place 2 sprays into both nostrils daily. Patient not taking: Reported on 04/29/2023 09/28/21   Claiborne Rigg, NP  fluticasone [facility]) 50 MCG/ACT nasal spray Place 2 sprays into both nostrils daily. 03/04/23   Mliss Sax, MD      Allergies    Patient has no known allergies.    Review of Systems   Review of Systems   Respiratory:  Positive for shortness of breath.     Physical Exam Updated Vital Signs BP (!) 169/95 (BP Location: Left Arm)   Pulse (!) 113   Temp 97.6 F (36.4 C) (Oral)   Resp 16   SpO2 93%  Physical Exam Vitals and nursing note reviewed.  Constitutional:      Appearance: He is well-developed.  HENT:     Head: Normocephalic.  Cardiovascular:     Rate and Rhythm: Regular rhythm. Tachycardia present.     Heart sounds: No murmur heard. Pulmonary:     Effort: Pulmonary effort is normal.     Breath sounds: No wheezing, rhonchi or rales.  Abdominal:     General: Bowel sounds are normal.     Palpations: Abdomen is soft.     Tenderness: There is no abdominal tenderness. There is no guarding or rebound.  Musculoskeletal:        General: Normal range of motion.     Cervical back: Normal range of motion and neck supple.  Skin:    General: Skin is warm and dry.  Neurological:     General: No focal deficit present.     Mental Status: He is alert and oriented to person, place, and time.     ED Results / Procedures / Treatments   Labs (all labs ordered are listed, but only abnormal results are displayed) Labs Reviewed  CBC WITH DIFFERENTIAL/PLATELET - Abnormal; Notable for the following components:  Result Value   WBC 13.2 (*)    Neutro Abs 10.4 (*)    Monocytes Absolute 1.1 (*)    Abs Immature Granulocytes 0.16 (*)    All other components within normal limits  BASIC METABOLIC PANEL - Abnormal; Notable for the following components:   Glucose, Bld 109 (*)    All other components within normal limits  RESP PANEL BY RT-PCR (RSV, FLU A&B, COVID)  [id number]  EXPECTORATED SPUTUM ASSESSMENT W GRAM STAIN, RFLX TO RESP C   Results for orders placed or performed during the hospital encounter of 04/29/23  Resp panel by RT-PCR (RSV, Flu A&B, Covid) Anterior Nasal Swab   Collection Time: 04/29/23  7:33 PM   Specimen: Anterior Nasal Swab  Result Value Ref Range   SARS Coronavirus  2 by RT PCR NEGATIVE NEGATIVE   Influenza A by PCR NEGATIVE NEGATIVE   Influenza B by PCR NEGATIVE NEGATIVE   Resp Syncytial Virus by PCR NEGATIVE NEGATIVE  CBC with Differential   Collection Time: 04/29/23 10:55 PM  Result Value Ref Range   WBC 13.2 (H) 4.0 - 10.5 K/uL   RBC 4.49 4.22 - 5.81 MIL/uL   Hemoglobin 14.1 13.0 - 17.0 g/dL   HCT 08.6 57.8 - 46.9 %   MCV 91.5 80.0 - 100.0 fL   MCH 31.4 26.0 - 34.0 pg   MCHC 34.3 30.0 - 36.0 g/dL   RDW 62.9 52.8 - 41.3 %   Platelets 256 150 - 400 K/uL   nRBC 0.0 0.0 - 0.2 %   Neutrophils Relative % 79 %   Neutro Abs 10.4 (H) 1.7 - 7.7 K/uL   Lymphocytes Relative 11 %   Lymphs Abs 1.4 0.7 - 4.0 K/uL   Monocytes Relative 9 %   Monocytes Absolute 1.1 (H) 0.1 - 1.0 K/uL   Eosinophils Relative 0 %   Eosinophils Absolute 0.0 0.0 - 0.5 K/uL   Basophils Relative 0 %   Basophils Absolute 0.1 0.0 - 0.1 K/uL   Immature Granulocytes 1 %   Abs Immature Granulocytes 0.16 (H) 0.00 - 0.07 K/uL  Basic metabolic panel   Collection Time: 04/29/23 10:55 PM  Result Value Ref Range   Sodium 136 135 - 145 mmol/L   Potassium 3.9 3.5 - 5.1 mmol/L   Chloride 101 98 - 111 mmol/L   CO2 26 22 - 32 mmol/L   Glucose, Bld 109 (H) 70 - 99 mg/dL   BUN 13 6 - 20 mg/dL   Creatinine, Ser 2.44 0.61 - 1.24 mg/dL   Calcium 9.0 8.9 - 01.0 mg/dL   GFR, Estimated >27 >25 mL/min   Anion gap 9 5 - 15   Results for orders placed or performed during the hospital encounter of 04/29/23  Resp panel by RT-PCR (RSV, Flu A&B, Covid) Anterior Nasal Swab   Collection Time: 04/29/23  7:33 PM   Specimen: Anterior Nasal Swab  Result Value Ref Range   SARS Coronavirus 2 by RT PCR NEGATIVE NEGATIVE   Influenza A by PCR NEGATIVE NEGATIVE   Influenza B by PCR NEGATIVE NEGATIVE   Resp Syncytial Virus by PCR NEGATIVE NEGATIVE  CBC with Differential   Collection Time: 04/29/23 10:55 PM  Result Value Ref Range   WBC 13.2 (H) 4.0 - 10.5 K/uL   RBC 4.49 4.22 - 5.81 MIL/uL   Hemoglobin  14.1 13.0 - 17.0 g/dL   HCT 36.6 44.0 - 34.7 %   MCV 91.5 80.0 - 100.0 fL   MCH 31.4 26.0 -  34.0 pg   MCHC 34.3 30.0 - 36.0 g/dL   RDW 28.4 13.2 - 44.0 %   Platelets 256 150 - 400 K/uL   nRBC 0.0 0.0 - 0.2 %   Neutrophils Relative % 79 %   Neutro Abs 10.4 (H) 1.7 - 7.7 K/uL   Lymphocytes Relative 11 %   Lymphs Abs 1.4 0.7 - 4.0 K/uL   Monocytes Relative 9 %   Monocytes Absolute 1.1 (H) 0.1 - 1.0 K/uL   Eosinophils Relative 0 %   Eosinophils Absolute 0.0 0.0 - 0.5 K/uL   Basophils Relative 0 %   Basophils Absolute 0.1 0.0 - 0.1 K/uL   Immature Granulocytes 1 %   Abs Immature Granulocytes 0.16 (H) 0.00 - 0.07 K/uL  Basic metabolic panel   Collection Time: 04/29/23 10:55 PM  Result Value Ref Range   Sodium 136 135 - 145 mmol/L   Potassium 3.9 3.5 - 5.1 mmol/L   Chloride 101 98 - 111 mmol/L   CO2 26 22 - 32 mmol/L   Glucose, Bld 109 (H) 70 - 99 mg/dL   BUN 13 6 - 20 mg/dL   Creatinine, Ser 1.02 0.61 - 1.24 mg/dL   Calcium 9.0 8.9 - 72.5 mg/dL   GFR, Estimated >36 >64 mL/min   Anion gap 9 5 - 15     EKG EKG Interpretation Date/Time:  Thursday April 29 2023 19:35:48 EST Ventricular Rate:  119 PR Interval:  139 QRS Duration:  94 QT Interval:  307 QTC Calculation: 432 R Axis:   72  Text Interpretation: Sinus tachycardia Confirmed by Vivi Barrack [phone]) on 04/29/2023 11:38:36 PM  Radiology CT Angio Chest PE W and/or Wo Contrast Result Date: 04/30/2023 CLINICAL DATA:  Shortness of breath and middle back pain. EXAM: CT ANGIOGRAPHY CHEST WITH CONTRAST TECHNIQUE: Multidetector CT imaging of the chest was performed using the standard protocol during bolus administration of intravenous contrast. Multiplanar CT image reconstructions and MIPs were obtained to evaluate the vascular anatomy. RADIATION DOSE REDUCTION: This exam was performed according to the departmental dose-optimization program which includes automated exposure control, adjustment of the mA and/or kV according  to patient size and/or use of iterative reconstruction technique. CONTRAST:  75mL OMNIPAQUE IOHEXOL 350 MG/ML SOLN COMPARISON:  Sep 17, 2021 FINDINGS: Cardiovascular: The thoracic aorta is normal in appearance. The segmental and subsegmental pulmonary arteries are limited in evaluation secondary to suboptimal opacification with intravenous contrast. No evidence of pulmonary embolism. Normal heart size. No pericardial effusion. Mediastinum/Nodes: There is mild AP window and bilateral hilar lymphadenopathy. Thyroid gland, trachea, and esophagus demonstrate no significant findings. Lungs/Pleura: Mild, ill-defined tree-in-bud appearing infiltrates are seen throughout both lungs. This is slightly more prominent within the right middle lobe and right lower lobe. An 11 mm non calcified lung nodule is seen within the right lower lobe (axial CT image 95, CT series 12). This is not clearly identified on the prior study. No pleural effusion or pneumothorax is identified. Upper Abdomen: There is a small hiatal hernia. Musculoskeletal: No chest wall abnormality. No acute or significant osseous findings. Review of the MIP images confirms the above findings. IMPRESSION: 1. No evidence of pulmonary embolism. 2. Mild, ill-defined bilateral tree-in-bud appearing infiltrates, likely infectious in etiology. 3. 11 mm non calcified right lower lobe lung nodule. Consider one of the following in 3 months for both low-risk and high-risk individuals: (a) repeat chest CT, (b) follow-up PET-CT, or (c) tissue sampling. This recommendation follows the consensus statement: Guidelines for Management of Incidental Pulmonary  Nodules Detected on CT Images: From the Fleischner Society 2017; Radiology 2017; 284:228-243. 4. Mild AP window and bilateral hilar lymphadenopathy, likely reactive in nature. 5. Small hiatal hernia. Electronically Signed   By: Aram Candela M.D.   On: 04/30/2023 00:48   DG Chest 2 View Result Date: 04/29/2023 CLINICAL  DATA:  Shortness of breath over the last couple of days with middle back pain EXAM: CHEST - 2 VIEW COMPARISON:  03/24/2021 and CT chest 09/17/2021 FINDINGS: Subsegmental atelectasis in the left lower lung. The lungs are otherwise clear. Stable cardiomediastinal silhouette. No displaced rib fractures. IMPRESSION: No active cardiopulmonary disease. Electronically Signed   By: Minerva Fester M.D.   On: 04/29/2023 19:40    Procedures Procedures    Medications Ordered in ED Medications  cefTRIAXone (ROCEPHIN) 1 g in sodium chloride 0.9 % 100 mL IVPB (has no administration in time range)  azithromycin (ZITHROMAX) tablet 500 mg (has no administration in time range)  ipratropium-albuterol (DUONEB) 0.5-2.5 (3) MG/3ML nebulizer solution 3 mL (3 mLs Nebulization Given 04/29/23 2300)  sodium chloride 0.9 % bolus 1,000 mL (1,000 mLs Intravenous New Bag/Given 04/29/23 2301)  methylPREDNISolone sodium succinate (SOLU-MEDROL) 125 mg/2 mL injection 125 mg (125 mg Intravenous Given 04/29/23 2300)  iohexol (OMNIPAQUE) 350 MG/ML injection 75 mL (75 mLs Intravenous Contrast Given 04/30/23 0014)    ED Course/ Medical Decision Making/ A&P                                 Medical Decision Making This patient presents to the ED for concern of cough, this involves an extensive number of treatment options, and is a complaint that carries with it a high risk of complications and morbidity.  The differential diagnosis includes COPD exacerbation, PNA, PE, PTX, viral URI, COVID   Co morbidities that complicate the patient evaluation  COPD   Additional history obtained:  Additional history and/or information obtained from chart review, notable for Office visit this morning   Lab Tests:  I Ordered, and personally interpreted labs.  The pertinent results include:  Leukocytosis 13.3; negative viral panel; no electrolyte abnormalities, normal renal function    Imaging Studies ordered:  I ordered imaging  studies including CXR I independently visualized and interpreted imaging which showed No acute findings I agree with the radiologist interpretation   Cardiac Monitoring:  The patient was maintained on a cardiac monitor.  I personally viewed and interpreted the cardiac monitored which showed an underlying rhythm of: Sinus tachycardia   Medicines ordered and prescription drug management:  I ordered medication including Solumedrol  for COPD exacerbation Reevaluation of the patient after these medicines showed that the patient stayed the same I have reviewed the patients home medicines and have made adjustments as needed   Test Considered:  CTA r/o PE given persistent tachycardia and hypoxia. No PNA on CXR.Mild leukocytosis.    Critical Interventions:  Oxygen at 2L provided for hypoxia to 88% while at rest.     Problem List / ED Course:  Sent to ED for COPD exacerbation/URI with hypoxia Found to be tachycardic in ED, hypoxic at rest to 88% CTA r/o PE pending  CTA negative for clots, has appearance of tree-in-bud appearing infiltrates. IV abx started.  Sputum culture ordered - patient attempting to collect.    Reevaluation:  After the interventions noted above, I reevaluated the patient and found that they have :improved   Social Determinants of Health:  Former smoker   Disposition:  After consideration of the diagnostic results and the patients response to treatment, I feel that the patient would benefit from Admission given hypoxia, failure of outpatient treatment, history of COPD.   Amount and/or Complexity of Data Reviewed Labs: ordered. Radiology: ordered.  Risk Prescription drug management.           Final Clinical Impression(s) / ED Diagnoses Final diagnoses:  Community acquired pneumonia, unspecified laterality  Hypoxia    Rx / DC Orders ED Discharge Orders     None         Elpidio Anis, [name] 04/30/23 [id number]

## 2023-04-29 NOTE — Progress Notes (Addendum)
Acute Office Visit  Subjective:     Patient ID: Jason Hurst, male    DOB: Mar 01, 1981, 42 y.o.   MRN: [id number]  Chief Complaint  Patient presents with   Breathing Problem    Walking gets patient out of breathe    HPI Patient is in today with complaints of cough and difficulty breathing with walking.  Symptoms began 5 days ago.  He has been out of his Symbicort but did get the medication refilled on Saturday and has resumed.  Continues to be winded.  Is using albuterol as well.  Denies any chest pain.Marland Kitchen  He is a non-smoker.  Review of Systems  Constitutional: Negative.  Negative for chills and fever.  HENT: Negative.    Respiratory:  Positive for cough, shortness of breath and wheezing.   Cardiovascular: Negative.  Negative for chest pain and palpitations.  Musculoskeletal: Negative.   Neurological: Negative.   Endo/Heme/Allergies: Negative.   Psychiatric/Behavioral: Negative.     Past Medical History:  Diagnosis Date   COVID-19 long hauler     Social History   Socioeconomic History   Marital status: Married    Spouse name: Not on file   Number of children: Not on file   Years of education: Not on file   Highest education level: Not on file  Occupational History   Not on file  Tobacco Use   Smoking status: Former    Current packs/day: 0.00    Average packs/day: 1.5 packs/day for 22.0 years (33.0 ttl pk-yrs)    Types: Cigarettes    Start date: 05/05/1996    Quit date: 05/05/2018    Years since quitting: 4.9    Passive exposure: Past   Smokeless tobacco: Never  Vaping Use   Vaping status: Never Used  Substance and Sexual Activity   Alcohol use: Yes    Comment: very occasionally.    Drug use: Yes    Types: Marijuana   Sexual activity: Yes  Other Topics Concern   Not on file  Social History Narrative   Not on file   Social Drivers of Health   Financial Resource Strain: Not on file  Food Insecurity: Not on file  Transportation Needs: Not on file   Physical Activity: Unknown (02/25/2023)   Exercise Vital Sign    Days of Exercise per Week: Patient declined    Minutes of Exercise per Session: Not on file  Stress: No Stress Concern Present (02/25/2023)   [employer] of Occupational Health - Occupational Stress Questionnaire    Feeling of Stress : Not at all  Social Connections: Unknown (02/25/2023)   Social Connection and Isolation Panel [NHANES]    Frequency of Communication with Friends and Family: Once a week    Frequency of Social Gatherings with Friends and Family: Once a week    Attends Religious Services: Never    Database administrator or Organizations: Patient declined    Attends Engineer, structural: Not on file    Marital Status: Married  Catering manager Violence: Not on file    History reviewed. No pertinent surgical history.  Family History  Problem Relation Age of Onset   Healthy Mother    Diabetes Father    Heart disease Father    Healthy Sister    Healthy Brother    Heart attack Maternal Uncle     No Known Allergies  Current Outpatient Medications on File Prior to Visit  Medication Sig Dispense Refill   albuterol (VENTOLIN HFA)  108 (90 Base) MCG/ACT inhaler Inhale 2 puffs into the lungs every 6 (six) hours as needed for wheezing or shortness of breath. 8 g 0   amoxicillin-clavulanate (AUGMENTIN) 875-125 MG tablet Take 1 tablet by mouth 2 (two) times daily. 14 tablet 0   benzonatate (TESSALON) 200 MG capsule Take 1 capsule (200 mg total) by mouth 2 (two) times daily as needed for cough. 20 capsule 0   fluticasone (FLONASE) 50 MCG/ACT nasal spray Place 2 sprays into both nostrils daily. 16 g 6   budesonide-formoterol (SYMBICORT) 160-4.5 MCG/ACT inhaler Inhale 2 puffs into the lungs in the morning and at bedtime. (Patient not taking: Reported on 04/29/2023) 10.2 g 5   fluticasone (FLONASE) 50 MCG/ACT nasal spray Place 2 sprays into both nostrils daily. (Patient not taking: Reported on  04/29/2023) 16 g 6   No current facility-administered medications on file prior to visit.    BP 126/88 (BP Location: Left Arm, Patient Position: Sitting, Cuff Size: Normal)   Pulse (!) 118   Temp 98.1 F (36.7 C)   Ht 5\' 10"  (1.778 m)   Wt 237 lb 3.2 oz (107.6 kg)   SpO2 90%   BMI 34.03 kg/m chart      Objective:    BP 126/88 (BP Location: Left Arm, Patient Position: Sitting, Cuff Size: Normal)   Pulse (!) 118   Temp 98.1 F (36.7 C)   Ht 5\' 10"  (1.778 m)   Wt 237 lb 3.2 oz (107.6 kg)   SpO2 90%   BMI 34.03 kg/m    Physical Exam Vitals and nursing note reviewed.  Constitutional:      Appearance: Normal appearance.  HENT:     Head: Normocephalic and atraumatic.     Right Ear: Tympanic membrane, ear canal and external ear normal.     Left Ear: Tympanic membrane, ear canal and external ear normal.     Mouth/Throat:     Mouth: Mucous membranes are moist.  Cardiovascular:     Rate and Rhythm: Normal rate and regular rhythm.     Pulses: Normal pulses.     Heart sounds: Normal heart sounds.  Pulmonary:     Effort: Pulmonary effort is normal.     Breath sounds: No wheezing or rhonchi.     Comments: Decreased breath sounds bilaterally.  Patient satting 88 to 89% on room air.  Does not appear to be in need any acute distress.  However, after DuoNeb treatment was provided today he continues to sat 88 to 89%.  We are able to get him up to about 90 for 95% with deep breathing.  Consulted with Dr. Doreene Burke, who suggested that patient needs to go to the emergency department to be seen. Abdominal:     General: Abdomen is flat.     Palpations: Abdomen is soft.  Musculoskeletal:        General: Normal range of motion.     Cervical back: Normal range of motion and neck supple.  Skin:    General: Skin is warm and dry.  Neurological:     General: No focal deficit present.     Mental Status: He is alert and oriented to person, place, and time. Mental status is at baseline.   Psychiatric:        Mood and Affect: Mood normal.        Behavior: Behavior normal.    Negative COVID and flu test No results found for any visits on 04/29/23.      Assessment &  Plan:   Problem List Items Addressed This Visit   None Visit Diagnoses       COPD with acute exacerbation (HCC)    -  Primary     Noncompliance with medication regimen          After consultation with Dr. Doreene Burke, patient advised that he would need to go to the emergency department.  Wife was called for her to pick him up here as he declines EMS transport.  Follow-up post hospital discharge.  No orders of the defined types were placed in this encounter.   No follow-ups on file.  Eulis Foster, FNP

## 2023-04-29 NOTE — Patient Instructions (Signed)
Please report to [facility] emergency department as soon as possible.  Report was called to the triage nurse who will be expecting you.

## 2023-04-30 ENCOUNTER — Emergency Department (HOSPITAL_COMMUNITY): Payer: 59

## 2023-04-30 ENCOUNTER — Telehealth (HOSPITAL_BASED_OUTPATIENT_CLINIC_OR_DEPARTMENT_OTHER): Payer: Self-pay | Admitting: Pulmonary Disease

## 2023-04-30 ENCOUNTER — Encounter (HOSPITAL_COMMUNITY): Payer: Self-pay | Admitting: Internal Medicine

## 2023-04-30 DIAGNOSIS — R911 Solitary pulmonary nodule: Secondary | ICD-10-CM

## 2023-04-30 DIAGNOSIS — Z8249 Family history of ischemic heart disease and other diseases of the circulatory system: Secondary | ICD-10-CM | POA: Diagnosis not present

## 2023-04-30 DIAGNOSIS — R918 Other nonspecific abnormal finding of lung field: Secondary | ICD-10-CM | POA: Diagnosis not present

## 2023-04-30 DIAGNOSIS — J157 Pneumonia due to Mycoplasma pneumoniae: Secondary | ICD-10-CM | POA: Diagnosis not present

## 2023-04-30 DIAGNOSIS — Z79899 Other long term (current) drug therapy: Secondary | ICD-10-CM | POA: Diagnosis not present

## 2023-04-30 DIAGNOSIS — J4489 Other specified chronic obstructive pulmonary disease: Secondary | ICD-10-CM

## 2023-04-30 DIAGNOSIS — J440 Chronic obstructive pulmonary disease with acute lower respiratory infection: Secondary | ICD-10-CM | POA: Diagnosis not present

## 2023-04-30 DIAGNOSIS — Z6832 Body mass index (BMI) 32.0-32.9, adult: Secondary | ICD-10-CM | POA: Diagnosis not present

## 2023-04-30 DIAGNOSIS — J441 Chronic obstructive pulmonary disease with (acute) exacerbation: Secondary | ICD-10-CM | POA: Diagnosis not present

## 2023-04-30 DIAGNOSIS — J189 Pneumonia, unspecified organism: Secondary | ICD-10-CM | POA: Diagnosis present

## 2023-04-30 DIAGNOSIS — R590 Localized enlarged lymph nodes: Secondary | ICD-10-CM | POA: Diagnosis not present

## 2023-04-30 DIAGNOSIS — R0902 Hypoxemia: Secondary | ICD-10-CM | POA: Diagnosis present

## 2023-04-30 DIAGNOSIS — Z1152 Encounter for screening for COVID-19: Secondary | ICD-10-CM | POA: Diagnosis not present

## 2023-04-30 DIAGNOSIS — Z87891 Personal history of nicotine dependence: Secondary | ICD-10-CM | POA: Diagnosis not present

## 2023-04-30 DIAGNOSIS — E669 Obesity, unspecified: Secondary | ICD-10-CM | POA: Diagnosis not present

## 2023-04-30 DIAGNOSIS — R0602 Shortness of breath: Secondary | ICD-10-CM | POA: Diagnosis not present

## 2023-04-30 DIAGNOSIS — B9789 Other viral agents as the cause of diseases classified elsewhere: Secondary | ICD-10-CM | POA: Diagnosis not present

## 2023-04-30 DIAGNOSIS — R000 Tachycardia, unspecified: Secondary | ICD-10-CM | POA: Diagnosis present

## 2023-04-30 DIAGNOSIS — M549 Dorsalgia, unspecified: Secondary | ICD-10-CM | POA: Diagnosis not present

## 2023-04-30 DIAGNOSIS — Z8616 Personal history of COVID-19: Secondary | ICD-10-CM | POA: Diagnosis not present

## 2023-04-30 LAB — CBC WITH DIFFERENTIAL/PLATELET
Abs Immature Granulocytes: 0.2 10*3/uL — ABNORMAL HIGH (ref 0.00–0.07)
Basophils Absolute: 0 10*3/uL (ref 0.0–0.1)
Basophils Relative: 0 %
Eosinophils Absolute: 0 10*3/uL (ref 0.0–0.5)
Eosinophils Relative: 0 %
HCT: 40.5 % (ref 39.0–52.0)
Hemoglobin: 13.7 g/dL (ref 13.0–17.0)
Immature Granulocytes: 2 %
Lymphocytes Relative: 7 %
Lymphs Abs: 1 10*3/uL (ref 0.7–4.0)
MCH: 31 pg (ref 26.0–34.0)
MCHC: 33.8 g/dL (ref 30.0–36.0)
MCV: 91.6 fL (ref 80.0–100.0)
Monocytes Absolute: 0.3 10*3/uL (ref 0.1–1.0)
Monocytes Relative: 2 %
Neutro Abs: 11.9 10*3/uL — ABNORMAL HIGH (ref 1.7–7.7)
Neutrophils Relative %: 89 %
Platelets: 222 10*3/uL (ref 150–400)
RBC: 4.42 MIL/uL (ref 4.22–5.81)
RDW: 12.6 % (ref 11.5–15.5)
WBC: 13.4 10*3/uL — ABNORMAL HIGH (ref 4.0–10.5)
nRBC: 0 % (ref 0.0–0.2)

## 2023-04-30 LAB — EXPECTORATED SPUTUM ASSESSMENT W GRAM STAIN, RFLX TO RESP C: Special Requests: NORMAL

## 2023-04-30 LAB — RENAL FUNCTION PANEL
Albumin: 3.3 g/dL — ABNORMAL LOW (ref 3.5–5.0)
Anion gap: 8 (ref 5–15)
BUN: 12 mg/dL (ref 6–20)
CO2: 26 mmol/L (ref 22–32)
Calcium: 9 mg/dL (ref 8.9–10.3)
Chloride: 102 mmol/L (ref 98–111)
Creatinine, Ser: 0.85 mg/dL (ref 0.61–1.24)
GFR, Estimated: >60 mL/min (ref >60)
Glucose, Bld: 140 mg/dL — ABNORMAL HIGH (ref 70–99)
Phosphorus: 4.3 mg/dL (ref 2.5–4.6)
Potassium: 4.2 mmol/L (ref 3.5–5.1)
Sodium: 136 mmol/L (ref 135–145)

## 2023-04-30 LAB — PROCALCITONIN: Procalcitonin: <0.1 ng/mL

## 2023-04-30 LAB — MAGNESIUM: Magnesium: 2.3 mg/dL (ref 1.7–2.4)

## 2023-04-30 MED ORDER — FLUTICASONE FUROATE-VILANTEROL 100-25 MCG/ACT IN AEPB
1.0000 | INHALATION_SPRAY | Freq: Every day | RESPIRATORY_TRACT | Status: DC
  Administered 2023-05-01: 1 via RESPIRATORY_TRACT
  Filled 2023-04-30: qty 28

## 2023-04-30 MED ORDER — UMECLIDINIUM BROMIDE 62.5 MCG/ACT IN AEPB
1.0000 | INHALATION_SPRAY | Freq: Every day | RESPIRATORY_TRACT | Status: DC
  Administered 2023-05-01: 1 via RESPIRATORY_TRACT
  Filled 2023-04-30: qty 7

## 2023-04-30 MED ORDER — SODIUM CHLORIDE 0.9 % IV SOLN
2.0000 g | INTRAVENOUS | Status: DC
  Administered 2023-05-01: 2 g via INTRAVENOUS
  Filled 2023-04-30: qty 20

## 2023-04-30 MED ORDER — IOHEXOL 350 MG/ML SOLN
75.0000 mL | Freq: Once | INTRAVENOUS | Status: AC | PRN
  Administered 2023-04-30: 75 mL via INTRAVENOUS

## 2023-04-30 MED ORDER — GUAIFENESIN-DM 100-10 MG/5ML PO SYRP: 5.0000 mL | ORAL_SOLUTION | ORAL | Status: DC | PRN

## 2023-04-30 MED ORDER — ALBUTEROL SULFATE (2.5 MG/3ML) 0.083% IN NEBU: 3.0000 mL | INHALATION_SOLUTION | Freq: Four times a day (QID) | RESPIRATORY_TRACT | Status: DC | PRN

## 2023-04-30 MED ORDER — GUAIFENESIN ER 600 MG PO TB12
600.0000 mg | ORAL_TABLET | Freq: Two times a day (BID) | ORAL | Status: DC
Start: 2023-04-30 — End: 2023-05-01
  Administered 2023-04-30 – 2023-05-01 (×3): 600 mg via ORAL
  Filled 2023-04-30 (×3): qty 1

## 2023-04-30 MED ORDER — ENOXAPARIN SODIUM 40 MG/0.4ML IJ SOSY: 40.0000 mg | PREFILLED_SYRINGE | INTRAMUSCULAR | Status: DC

## 2023-04-30 MED ORDER — IPRATROPIUM-ALBUTEROL 0.5-2.5 (3) MG/3ML IN SOLN: 3.0000 mL | RESPIRATORY_TRACT | Status: DC | PRN

## 2023-04-30 MED ORDER — PREDNISONE 5 MG PO TABS
50.0000 mg | ORAL_TABLET | Freq: Every day | ORAL | Status: DC
  Administered 2023-05-01: 50 mg via ORAL
  Filled 2023-04-30: qty 2

## 2023-04-30 MED ORDER — ALUM & MAG HYDROXIDE-SIMETH 200-200-20 MG/5ML PO SUSP
30.0000 mL | Freq: Three times a day (TID) | ORAL | Status: DC | PRN
  Filled 2023-04-30: qty 30

## 2023-04-30 MED ORDER — ENOXAPARIN SODIUM 60 MG/0.6ML IJ SOSY
50.0000 mg | PREFILLED_SYRINGE | INTRAMUSCULAR | Status: DC
  Administered 2023-04-30 – 2023-05-01 (×2): 50 mg via SUBCUTANEOUS
  Filled 2023-04-30 (×2): qty 0.6

## 2023-04-30 MED ORDER — BENZONATATE 100 MG PO CAPS: 200.0000 mg | ORAL_CAPSULE | Freq: Two times a day (BID) | ORAL | Status: DC | PRN

## 2023-04-30 MED ORDER — SODIUM CHLORIDE 0.9 % IV SOLN
500.0000 mg | INTRAVENOUS | Status: DC
  Administered 2023-05-01: 500 mg via INTRAVENOUS
  Filled 2023-04-30: qty 5

## 2023-04-30 MED ORDER — SODIUM CHLORIDE 0.9 % IV SOLN
1.0000 g | Freq: Once | INTRAVENOUS | Status: AC
  Administered 2023-04-30: 1 g via INTRAVENOUS
  Filled 2023-04-30: qty 10

## 2023-04-30 MED ORDER — PANTOPRAZOLE SODIUM 40 MG PO TBEC
40.0000 mg | DELAYED_RELEASE_TABLET | Freq: Every day | ORAL | Status: DC
  Administered 2023-04-30 – 2023-05-01 (×2): 40 mg via ORAL
  Filled 2023-04-30 (×2): qty 1

## 2023-04-30 MED ORDER — AZITHROMYCIN 250 MG PO TABS
500.0000 mg | ORAL_TABLET | Freq: Once | ORAL | Status: AC
  Administered 2023-04-30: 500 mg via ORAL
  Filled 2023-04-30: qty 2

## 2023-04-30 NOTE — Assessment & Plan Note (Addendum)
Cont home nebs 

## 2023-04-30 NOTE — Assessment & Plan Note (Signed)
COVID, FLU, RSV neg Failed Augmentin outpt. PNA pathway Empiric rocephin / azithro Check RVP Suspicious for mycoplasma given known outbreak nation wide in past couple of weeks, would also explain why Augmentin failed to treat.

## 2023-04-30 NOTE — H&P (Signed)
History and Physical    Patient: Jason Hurst ZOX:[id number] DOB: Sep 05, 1980 DOA: 04/29/2023 DOS: the patient was seen and examined on 04/30/2023 PCP: Jason Sax, MD  Patient coming from: Home  Chief Complaint:  Chief Complaint  Patient presents with   Shortness of Breath   HPI: Jason Hurst is a 42 y.o. male with medical history significant of asthma / COPD, prior COVID-19.  Pt in to ED with c/o   Review of Systems: As mentioned in the history of present illness. All other systems reviewed and are negative. Past Medical History:  Diagnosis Date   COPD (chronic obstructive pulmonary disease) (HCC)    COVID-19 long hauler    No past surgical history on file. Social History:  reports that he quit smoking about 4 years ago. His smoking use included cigarettes. He started smoking about 27 years ago. He has a 33 pack-year smoking history. He has been exposed to tobacco smoke. He has never used smokeless tobacco. He reports current alcohol use. He reports current drug use. Drug: Marijuana.  No Known Allergies  Family History  Problem Relation Age of Onset   Healthy Mother    Diabetes Father    Heart disease Father    Healthy Sister    Healthy Brother    Heart attack Maternal Uncle     Prior to Admission medications   Medication Sig Start Date End Date Taking? Authorizing Provider  albuterol (VENTOLIN HFA) 108 (90 Base) MCG/ACT inhaler Inhale 2 puffs into the lungs every 6 (six) hours as needed for wheezing or shortness of breath. 05/25/22  Yes Viviano Simas, FNP  amoxicillin-clavulanate (AUGMENTIN) 875-125 MG tablet Take 1 tablet by mouth 2 (two) times daily. 04/22/23  Yes Waldon Merl, [name]  benzonatate (TESSALON) 200 MG capsule Take 1 capsule (200 mg total) by mouth 2 (two) times daily as needed for cough. 02/25/23  Yes Jason Sax, MD  fluticasone [facility]) 50 MCG/ACT nasal spray Place 2 sprays into both nostrils daily. Patient taking  differently: Place 2 sprays into both nostrils as needed for allergies or rhinitis. 03/04/23  Yes Jason Sax, MD    Physical Exam: Vitals:   04/29/23 2326 04/30/23 0215 04/30/23 0500 04/30/23 0518  BP: (!) 169/95 (!) 135/94 (!) 145/98   Pulse: (!) 113 96 96   Resp: 16 17 19    Temp: 97.6 F (36.4 C) 98.1 F (36.7 C)  98.3 F (36.8 C)  TempSrc: Oral Oral  Oral  SpO2: 93% 94% 93%    Constitutional: NAD, calm, comfortable Respiratory: clear to auscultation bilaterally, no wheezing, no crackles. Normal respiratory effort. No accessory muscle use.  Cardiovascular: Regular rate and rhythm, no murmurs / rubs / gallops. No extremity edema. 2+ pedal pulses. No carotid bruits.  Abdomen: no tenderness, no masses palpated. No hepatosplenomegaly. Bowel sounds positive.  Neurologic: CN 2-12 grossly intact. Sensation intact, DTR normal. Strength 5/5 in all 4.  Psychiatric: Normal judgment and insight. Alert and oriented x 3. Normal mood.   Data Reviewed:    Labs on Admission: I have personally reviewed following labs and imaging studies  CBC: Recent Labs  Lab 04/29/23 2255  WBC 13.2*  NEUTROABS 10.4*  HGB 14.1  HCT 41.1  MCV 91.5  PLT 256   Basic Metabolic Panel: Recent Labs  Lab 04/29/23 2255  NA 136  K 3.9  CL 101  CO2 26  GLUCOSE 109*  BUN 13  CREATININE 0.92  CALCIUM 9.0   GFR: Estimated Creatinine  Clearance: 131 mL/min (by C-G formula based on SCr of 0.92 mg/dL). Liver Function Tests: No results for input(s): "AST", "ALT", "ALKPHOS", "BILITOT", "PROT", "ALBUMIN" in the last 168 hours. No results for input(s): "LIPASE", "AMYLASE" in the last 168 hours. No results for input(s): "AMMONIA" in the last 168 hours. Coagulation Profile: No results for input(s): "INR", "PROTIME" in the last 168 hours. Cardiac Enzymes: No results for input(s): "CKTOTAL", "CKMB", "CKMBINDEX", "TROPONINI" in the last 168 hours. BNP (last 3 results) No results for input(s): "PROBNP"  in the last 8760 hours. HbA1C: No results for input(s): "HGBA1C" in the last 72 hours. CBG: No results for input(s): "GLUCAP" in the last 168 hours. Lipid Profile: No results for input(s): "CHOL", "HDL", "LDLCALC", "TRIG", "CHOLHDL", "LDLDIRECT" in the last 72 hours. Thyroid Function Tests: No results for input(s): "TSH", "T4TOTAL", "FREET4", "T3FREE", "THYROIDAB" in the last 72 hours. Anemia Panel: No results for input(s): "VITAMINB12", "FOLATE", "FERRITIN", "TIBC", "IRON", "RETICCTPCT" in the last 72 hours. Urine analysis:    Component Value Date/Time   COLORURINE YELLOW 07/24/2020 0953   APPEARANCEUR CLEAR 07/24/2020 0953   LABSPEC 1.020 07/24/2020 0953   PHURINE 7.5 07/24/2020 0953   GLUCOSEU NEGATIVE 07/24/2020 0953   HGBUR NEGATIVE 07/24/2020 0953   BILIRUBINUR NEGATIVE 07/24/2020 0953   KETONESUR NEGATIVE 07/24/2020 0953   UROBILINOGEN 0.2 07/24/2020 0953   NITRITE NEGATIVE 07/24/2020 0953   LEUKOCYTESUR NEGATIVE 07/24/2020 0953    Radiological Exams on Admission: CT Angio Chest PE W and/or Wo Contrast Result Date: 04/30/2023 CLINICAL DATA:  Shortness of breath and middle back pain. EXAM: CT ANGIOGRAPHY CHEST WITH CONTRAST TECHNIQUE: Multidetector CT imaging of the chest was performed using the standard protocol during bolus administration of intravenous contrast. Multiplanar CT image reconstructions and MIPs were obtained to evaluate the vascular anatomy. RADIATION DOSE REDUCTION: This exam was performed according to the departmental dose-optimization program which includes automated exposure control, adjustment of the mA and/or kV according to patient size and/or use of iterative reconstruction technique. CONTRAST:  75mL OMNIPAQUE IOHEXOL 350 MG/ML SOLN COMPARISON:  Sep 17, 2021 FINDINGS: Cardiovascular: The thoracic aorta is normal in appearance. The segmental and subsegmental pulmonary arteries are limited in evaluation secondary to suboptimal opacification with intravenous  contrast. No evidence of pulmonary embolism. Normal heart size. No pericardial effusion. Mediastinum/Nodes: There is mild AP window and bilateral hilar lymphadenopathy. Thyroid gland, trachea, and esophagus demonstrate no significant findings. Lungs/Pleura: Mild, ill-defined tree-in-bud appearing infiltrates are seen throughout both lungs. This is slightly more prominent within the right middle lobe and right lower lobe. An 11 mm non calcified lung nodule is seen within the right lower lobe (axial CT image 95, CT series 12). This is not clearly identified on the prior study. No pleural effusion or pneumothorax is identified. Upper Abdomen: There is a small hiatal hernia. Musculoskeletal: No chest wall abnormality. No acute or significant osseous findings. Review of the MIP images confirms the above findings. IMPRESSION: 1. No evidence of pulmonary embolism. 2. Mild, ill-defined bilateral tree-in-bud appearing infiltrates, likely infectious in etiology. 3. 11 mm non calcified right lower lobe lung nodule. Consider one of the following in 3 months for both low-risk and high-risk individuals: (a) repeat chest CT, (b) follow-up PET-CT, or (c) tissue sampling. This recommendation follows the consensus statement: Guidelines for Management of Incidental Pulmonary Nodules Detected on CT Images: From the Fleischner Society 2017; Radiology 2017; 284:228-243. 4. Mild AP window and bilateral hilar lymphadenopathy, likely reactive in nature. 5. Small hiatal hernia. Electronically Signed   By:  Aram Candela M.D.   On: 04/30/2023 00:48   DG Chest 2 View Result Date: 04/29/2023 CLINICAL DATA:  Shortness of breath over the last couple of days with middle back pain EXAM: CHEST - 2 VIEW COMPARISON:  03/24/2021 and CT chest 09/17/2021 FINDINGS: Subsegmental atelectasis in the left lower lung. The lungs are otherwise clear. Stable cardiomediastinal silhouette. No displaced rib fractures. IMPRESSION: No active cardiopulmonary  disease. Electronically Signed   By: Minerva Fester M.D.   On: 04/29/2023 19:40    EKG: Independently reviewed.   Assessment and Plan: * Atypical pneumonia COVID, FLU, RSV neg Failed Augmentin outpt. PNA pathway Empiric rocephin / azithro Check RVP Suspicious for mycoplasma given known outbreak nation wide in past couple of weeks, would also explain why Augmentin failed to treat.  Pulmonary nodule 1 cm or greater in diameter Needs follow up with pulm (either needs biopsy or repeat CT in 3 months or PET scan). Didn't have chance to discuss this with patient at time of admission. Actually, his pulmonologist (Dr. Everardo All) just happens to be on for [facility] coverage today.  COPD with asthma (HCC) Cont home nebs      Advance Care Planning:   Code Status: Full Code  Consults: None yet, presumably message Dr. Everardo All after 7am  Family Communication: No family in room  Severity of Illness: The appropriate patient status for this patient is OBSERVATION. Observation status is judged to be reasonable and necessary in order to provide the required intensity of service to ensure the patient's safety. The patient's presenting symptoms, physical exam findings, and initial radiographic and laboratory data in the context of their medical condition is felt to place them at decreased risk for further clinical deterioration. Furthermore, it is anticipated that the patient will be medically stable for discharge from the hospital within 2 midnights of admission.   Author: Hillary Bow., DO 04/30/2023 5:52 AM  For on call review [email or web address].

## 2023-04-30 NOTE — Plan of Care (Signed)
  Problem: Education: Goal: Knowledge of General Education information will improve Description: Including pain rating scale, medication(s)/side effects and non-pharmacologic comfort measures Outcome: Progressing   Problem: Health Behavior/Discharge Planning: Goal: Ability to manage health-related needs will improve Outcome: Progressing   Problem: Clinical Measurements: Goal: Ability to maintain clinical measurements within normal limits will improve Outcome: Progressing Goal: Will remain free from infection Outcome: Progressing Goal: Diagnostic test results will improve Outcome: Progressing Goal: Respiratory complications will improve Outcome: Progressing Goal: Cardiovascular complication will be avoided Outcome: Progressing   Problem: Activity: Goal: Risk for activity intolerance will decrease Outcome: Progressing   Problem: Nutrition: Goal: Adequate nutrition will be maintained Outcome: Completed/Met   Problem: Coping: Goal: Level of anxiety will decrease Outcome: Progressing   Problem: Elimination: Goal: Will not experience complications related to bowel motility Outcome: Progressing Goal: Will not experience complications related to urinary retention Outcome: Completed/Met   Problem: Pain Management: Goal: General experience of comfort will improve Outcome: Progressing   Problem: Safety: Goal: Ability to remain free from injury will improve Outcome: Progressing   Problem: Skin Integrity: Goal: Risk for impaired skin integrity will decrease Outcome: Adequate for Discharge

## 2023-04-30 NOTE — Progress Notes (Signed)
PROGRESS NOTE  Jason Hurst:[id number] DOB: 1980-09-29   PCP: Mliss Sax, MD  Patient is from: Home  DOA: 04/29/2023 LOS: 0  Chief complaints Chief Complaint  Patient presents with   Shortness of Breath     Brief Narrative / Interim history: 43 year old M with PMH of asthma/COPD, prior COVID-19 infection and former smoker with 40-pack-year history until 2020 presenting with shortness of breath and cough, and admitted with working diagnosis of atypical pneumonia.  Recently diagnosed with sinusitis and started on p.o. Augmentin that he took for 3 days.   In ED, tachycardic to 120s.  Mild leukocytosis to 13.2 with left shift.  COVID-19, influenza and RSV PCR nonreactive.  CT angio chest negative for PE but mild, ill-defined bilateral tree-in-bud appearing infiltrates concerning for infection and 11 mm noncalcified RLL nodule.  Patient was started on ceftriaxone, Zithromax and breathing treatments.   Subjective: Seen and examined earlier this morning.  No major events overnight of this morning.  No complaints.  Reports improvement in his breathing but not quite back to his baseline.  Objective: Vitals:   04/30/23 0518 04/30/23 0641 04/30/23 0642 04/30/23 0945  BP:   127/85 121/85  Pulse:   95 93  Resp:   16 20  Temp: 98.3 F (36.8 C)  98.7 F (37.1 C) 97.9 F (36.6 C)  TempSrc: Oral  Oral Oral  SpO2:   94% 91%  Weight:  103.1 kg    Height:  5\' 10"  (1.778 m)      Examination:  GENERAL: No apparent distress.  Nontoxic. HEENT: MMM.  Vision and hearing grossly intact.  NECK: Supple.  No apparent JVD.  RESP:  No IWOB.  Fair aeration bilaterally.  Rhonchi bilaterally. CVS:  RRR. Heart sounds normal.  ABD/GI/GU: BS+. Abd soft, NTND.  MSK/EXT:  Moves extremities. No apparent deformity. No edema.  SKIN: no apparent skin lesion or wound NEURO: Awake, alert and oriented appropriately.  No apparent focal neuro deficit. PSYCH: Calm. Normal affect.   Procedures:   None  Microbiology summarized: COVID-19, influenza and RSV PCR nonreactive Respiratory culture pending Full RVP pending  Assessment and plan: Atypical pneumonia: Presents with shortness of breath and cough.  CT angio chest as above.  He has underlying COPD.  Rhonchi and cough on exam. -Continue ceftriaxone and Zithromax -Resume steroid and breathing treatments. -Mucolytic's and antitussive -Incentive spirometry -PPI for GI prophylaxis -Follow full RVP and respiratory cultures  COPD exacerbation -Management as above  Pulmonary nodule: CT angio chest showed 1.1 cm RLL nodule.  Given patient's smoking history, needs close follow-up.  Notified patient's pulmonologist who has already ordered outpatient follow-up CT chest.  Patient has been notified.  Obesity Body mass index is 32.63 kg/m. -Encourage lifestyle change to lose weight          DVT prophylaxis:  On subcu Lovenox  Code Status: Full code Family Communication: None at bedside Level of care: Med-Surg Status is: Observation The patient will require care spanning > 2 midnights and should be moved to inpatient because: Atypical pneumonia/COPD exacerbation   Final disposition: Home Consultants:  None  35 minutes with more than 50% spent in reviewing records, counseling patient/family and coordinating care.   Sch Meds:  Scheduled Meds:  enoxaparin (LOVENOX) injection  50 mg Subcutaneous Q24H   fluticasone furoate-vilanterol  1 puff Inhalation Daily   guaiFENesin  600 mg Oral BID   pantoprazole  40 mg Oral Daily   [START ON 05/01/2023] predniSONE  50 mg Oral  Q breakfast   umeclidinium bromide  1 puff Inhalation Daily   Continuous Infusions:  [START ON 05/01/2023] azithromycin     [START ON 05/01/2023] cefTRIAXone (ROCEPHIN)  IV     PRN Meds:.alum & mag hydroxide-simeth, guaiFENesin-dextromethorphan, ipratropium-albuterol  Antimicrobials: Anti-infectives (From admission, onward)    Start     Dose/Rate  Route Frequency Ordered Stop   05/01/23 0200  cefTRIAXone (ROCEPHIN) 2 g in sodium chloride 0.9 % 100 mL IVPB        2 g 200 mL/hr over 30 Minutes Intravenous Every 24 hours 04/30/23 0536 05/06/23 0159   05/01/23 0200  azithromycin (ZITHROMAX) 500 mg in sodium chloride 0.9 % 250 mL IVPB        500 mg 250 mL/hr over 60 Minutes Intravenous Every 24 hours 04/30/23 0536 05/06/23 0159   04/30/23 0200  cefTRIAXone (ROCEPHIN) 1 g in sodium chloride 0.9 % 100 mL IVPB        1 g 200 mL/hr over 30 Minutes Intravenous  Once 04/30/23 0150 04/30/23 0343   04/30/23 0200  azithromycin (ZITHROMAX) tablet 500 mg        500 mg Oral  Once 04/30/23 0150 04/30/23 0341        I have personally reviewed the following labs and images: CBC: Recent Labs  Lab 04/29/23 2255 04/30/23 0829  WBC 13.2* 13.4*  NEUTROABS 10.4* 11.9*  HGB 14.1 13.7  HCT 41.1 40.5  MCV 91.5 91.6  PLT 256 222   BMP &GFR Recent Labs  Lab 04/29/23 2255 04/30/23 0829  NA 136 136  K 3.9 4.2  CL 101 102  CO2 26 26  GLUCOSE 109* 140*  BUN 13 12  CREATININE 0.92 0.85  CALCIUM 9.0 9.0  MG  --  2.3  PHOS  --  4.3   Estimated Creatinine Clearance: 138.9 mL/min (by C-G formula based on SCr of 0.85 mg/dL). Liver & Pancreas: Recent Labs  Lab 04/30/23 0829  ALBUMIN 3.3*   No results for input(s): "LIPASE", "AMYLASE" in the last 168 hours. No results for input(s): "AMMONIA" in the last 168 hours. Diabetic: No results for input(s): "HGBA1C" in the last 72 hours. No results for input(s): "GLUCAP" in the last 168 hours. Cardiac Enzymes: No results for input(s): "CKTOTAL", "CKMB", "CKMBINDEX", "TROPONINI" in the last 168 hours. No results for input(s): "PROBNP" in the last 8760 hours. Coagulation Profile: No results for input(s): "INR", "PROTIME" in the last 168 hours. Thyroid Function Tests: No results for input(s): "TSH", "T4TOTAL", "FREET4", "T3FREE", "THYROIDAB" in the last 72 hours. Lipid Profile: No results for  input(s): "CHOL", "HDL", "LDLCALC", "TRIG", "CHOLHDL", "LDLDIRECT" in the last 72 hours. Anemia Panel: No results for input(s): "VITAMINB12", "FOLATE", "FERRITIN", "TIBC", "IRON", "RETICCTPCT" in the last 72 hours. Urine analysis:    Component Value Date/Time   COLORURINE YELLOW 07/24/2020 0953   APPEARANCEUR CLEAR 07/24/2020 0953   LABSPEC 1.020 07/24/2020 0953   PHURINE 7.5 07/24/2020 0953   GLUCOSEU NEGATIVE 07/24/2020 0953   HGBUR NEGATIVE 07/24/2020 0953   BILIRUBINUR NEGATIVE 07/24/2020 0953   KETONESUR NEGATIVE 07/24/2020 0953   UROBILINOGEN 0.2 07/24/2020 0953   NITRITE NEGATIVE 07/24/2020 0953   LEUKOCYTESUR NEGATIVE 07/24/2020 0953   Sepsis Labs: Invalid input(s): "PROCALCITONIN", "LACTICIDVEN"  Microbiology: Recent Results (from the past 240 hours)  Resp panel by RT-PCR (RSV, Flu A&B, Covid) Anterior Nasal Swab     Status: None   Collection Time: 04/29/23  7:33 PM   Specimen: Anterior Nasal Swab  Result Value Ref Range Status  SARS Coronavirus 2 by RT PCR NEGATIVE NEGATIVE Final    Comment: (NOTE) SARS-CoV-2 target nucleic acids are NOT DETECTED.  The SARS-CoV-2 RNA is generally detectable in upper respiratory specimens during the acute phase of infection. The lowest concentration of SARS-CoV-2 viral copies this assay can detect is 138 copies/mL. A negative result does not preclude SARS-Cov-2 infection and should not be used as the sole basis for treatment or other patient management decisions. A negative result may occur with  improper specimen collection/handling, submission of specimen other than nasopharyngeal swab, presence of viral mutation(s) within the areas targeted by this assay, and inadequate number of viral copies(<138 copies/mL). A negative result must be combined with clinical observations, patient history, and epidemiological information. The expected result is Negative.  Fact Sheet for Patients:   BloggerCourse.com  Fact Sheet for Healthcare Providers:  SeriousBroker.it  This test is no t yet approved or cleared by the [place] FDA and  has been authorized for detection and/or diagnosis of SARS-CoV-2 by FDA under an Emergency Use Authorization (EUA). This EUA will remain  in effect (meaning this test can be used) for the duration of the COVID-19 declaration under Section 564(b)(1) of the Act, 21 U.S.C.section 360bbb-3(b)(1), unless the authorization is terminated  or revoked sooner.       Influenza A by PCR NEGATIVE NEGATIVE Final   Influenza B by PCR NEGATIVE NEGATIVE Final    Comment: (NOTE) The Xpert Xpress SARS-CoV-2/FLU/RSV plus assay is intended as an aid in the diagnosis of influenza from Nasopharyngeal swab specimens and should not be used as a sole basis for treatment. Nasal washings and aspirates are unacceptable for Xpert Xpress SARS-CoV-2/FLU/RSV testing.  Fact Sheet for Patients: BloggerCourse.com  Fact Sheet for Healthcare Providers: SeriousBroker.it  This test is not yet approved or cleared by the [place] FDA and has been authorized for detection and/or diagnosis of SARS-CoV-2 by FDA under an Emergency Use Authorization (EUA). This EUA will remain in effect (meaning this test can be used) for the duration of the COVID-19 declaration under Section 564(b)(1) of the Act, 21 U.S.C. section 360bbb-3(b)(1), unless the authorization is terminated or revoked.     Resp Syncytial Virus by PCR NEGATIVE NEGATIVE Final    Comment: (NOTE) Fact Sheet for Patients: BloggerCourse.com  Fact Sheet for Healthcare Providers: SeriousBroker.it  This test is not yet approved or cleared by the [place] FDA and has been authorized for detection and/or diagnosis of SARS-CoV-2 by FDA under an Emergency Use  Authorization (EUA). This EUA will remain in effect (meaning this test can be used) for the duration of the COVID-19 declaration under Section 564(b)(1) of the Act, 21 U.S.C. section 360bbb-3(b)(1), unless the authorization is terminated or revoked.  Performed at [facility], [address] [place], [place] [address]   Expectorated Sputum Assessment w Gram Stain, Rflx to Resp Cult     Status: None   Collection Time: 04/30/23  2:29 AM   Specimen: Expectorated Sputum  Result Value Ref Range Status   Specimen Description EXPECTORATED SPUTUM  Final   Special Requests Normal  Final   Sputum evaluation   Final    THIS SPECIMEN IS ACCEPTABLE FOR SPUTUM CULTURE Performed at [facility], [address] [place], [place] [address]    Report Status 04/30/2023 FINAL  Final  Culture, Respiratory w Gram Stain     Status: None (Preliminary result)   Collection Time: 04/30/23  2:29 AM  Result Value Ref Range Status  Specimen Description   Final    EXPECTORATED SPUTUM Performed at [facility], [address] [place], [place] [address]    Special Requests   Final    Normal Reflexed from [id number] Performed at [facility], [address] [place], [place] [address]    Gram Stain   Final    FEW WBC PRESENT, PREDOMINANTLY PMN FEW GRAM POSITIVE COCCI Performed at [facility], [address], [place], [place] [address]    Culture PENDING  Incomplete   Report Status PENDING  Incomplete    Radiology Studies: CT Angio Chest PE W and/or Wo Contrast Result Date: 04/30/2023 CLINICAL DATA:  Shortness of breath and middle back pain. EXAM: CT ANGIOGRAPHY CHEST WITH CONTRAST TECHNIQUE: Multidetector CT imaging of the chest was performed using the standard protocol during bolus administration of intravenous contrast. Multiplanar CT image reconstructions and MIPs were obtained to evaluate the vascular anatomy. RADIATION  DOSE REDUCTION: This exam was performed according to the departmental dose-optimization program which includes automated exposure control, adjustment of the mA and/or kV according to patient size and/or use of iterative reconstruction technique. CONTRAST:  75mL OMNIPAQUE IOHEXOL 350 MG/ML SOLN COMPARISON:  Sep 17, 2021 FINDINGS: Cardiovascular: The thoracic aorta is normal in appearance. The segmental and subsegmental pulmonary arteries are limited in evaluation secondary to suboptimal opacification with intravenous contrast. No evidence of pulmonary embolism. Normal heart size. No pericardial effusion. Mediastinum/Nodes: There is mild AP window and bilateral hilar lymphadenopathy. Thyroid gland, trachea, and esophagus demonstrate no significant findings. Lungs/Pleura: Mild, ill-defined tree-in-bud appearing infiltrates are seen throughout both lungs. This is slightly more prominent within the right middle lobe and right lower lobe. An 11 mm non calcified lung nodule is seen within the right lower lobe (axial CT image 95, CT series 12). This is not clearly identified on the prior study. No pleural effusion or pneumothorax is identified. Upper Abdomen: There is a small hiatal hernia. Musculoskeletal: No chest wall abnormality. No acute or significant osseous findings. Review of the MIP images confirms the above findings. IMPRESSION: 1. No evidence of pulmonary embolism. 2. Mild, ill-defined bilateral tree-in-bud appearing infiltrates, likely infectious in etiology. 3. 11 mm non calcified right lower lobe lung nodule. Consider one of the following in 3 months for both low-risk and high-risk individuals: (a) repeat chest CT, (b) follow-up PET-CT, or (c) tissue sampling. This recommendation follows the consensus statement: Guidelines for Management of Incidental Pulmonary Nodules Detected on CT Images: From the Fleischner Society 2017; Radiology 2017; 284:228-243. 4. Mild AP window and bilateral hilar lymphadenopathy,  likely reactive in nature. 5. Small hiatal hernia. Electronically Signed   By: Aram Candela M.D.   On: 04/30/2023 00:48   DG Chest 2 View Result Date: 04/29/2023 CLINICAL DATA:  Shortness of breath over the last couple of days with middle back pain EXAM: CHEST - 2 VIEW COMPARISON:  03/24/2021 and CT chest 09/17/2021 FINDINGS: Subsegmental atelectasis in the left lower lung. The lungs are otherwise clear. Stable cardiomediastinal silhouette. No displaced rib fractures. IMPRESSION: No active cardiopulmonary disease. Electronically Signed   By: Minerva Fester M.D.   On: 04/29/2023 19:40      Rohn Fritsch T. Audra Kagel [facility]  If 7PM-7AM, please contact night-coverage [email or web address] 04/30/2023, 12:46 PM

## 2023-04-30 NOTE — Assessment & Plan Note (Addendum)
Needs follow up with pulm (either needs biopsy or repeat CT in 3 months or PET scan). Didn't have chance to discuss this with patient at time of admission. Actually, his pulmonologist (Dr. Everardo All) just happens to be on for [facility] coverage today.

## 2023-04-30 NOTE — Progress Notes (Signed)
PCCM Progress Note  PCCM contacted to arrange outpatient CT and follow-up. Orders placed in separate encounter.

## 2023-04-30 NOTE — Telephone Encounter (Signed)
PCCM Progress Note  Patient with incidental nodule >1 cm on CTA. Pulmonary office contacted to arrange outpatient imaging and follow-up. Orders placed.

## 2023-04-30 NOTE — TOC Initial Note (Signed)
Transition of Care [facility]) - Initial/Assessment Note   Patient Details  Name: Jason Hurst MRN: [id number] Date of Birth: 12-06-1980  [facility]) CM/SW Contact:    Ewing Schlein, LCSW Phone Number: 04/30/2023, 9:39 AM  Clinical Narrative: Patient is from home with spouse. Patient is currently on 3L/min oxygen. TOC following for possible home oxygen if needed at discharge.                Expected Discharge Plan: Home/Self Care Barriers to Discharge: Continued Medical Work up  Expected Discharge Plan and Services In-house Referral: Clinical Social Work Living arrangements for the past 2 months: Single Family Home  Prior Living Arrangements/Services Living arrangements for the past 2 months: Single Family Home Lives with:: Spouse Patient language and need for interpreter reviewed:: Yes Need for Family Participation in Patient Care: No (Comment) Care giver support system in place?: Yes (comment) Criminal Activity/Legal Involvement Pertinent to Current Situation/Hospitalization: No - Comment as needed  Activities of Daily Living ADL Screening (condition at time of admission) Independently performs ADLs?: Yes (appropriate for developmental age) Is the patient deaf or have difficulty hearing?: No Does the patient have difficulty seeing, even when wearing glasses/contacts?: No Does the patient have difficulty concentrating, remembering, or making decisions?: No  Emotional Assessment Orientation: : Oriented to Self, Oriented to Place, Oriented to  Time, Oriented to Situation Alcohol / Substance Use: Not Applicable Psych Involvement: No (comment)  Admission diagnosis:  Hypoxia [R09.02] Atypical pneumonia [J18.9] Community acquired pneumonia, unspecified laterality [J18.9] Patient Active Problem List   Diagnosis Date Noted   Atypical pneumonia 04/30/2023   Pulmonary nodule 1 cm or greater in diameter 04/30/2023   Sore throat 06/10/2022   Chest pain 06/10/2022    Gastroesophageal reflux disease 02/05/2022   Elevated BP without diagnosis of hypertension 02/05/2022   Moderate persistent asthma without complication 09/22/2021   COPD with asthma (HCC) 09/22/2021   COVID-19 long hauler manifesting chronic dyspnea 06/17/2021   Encounter for removal of sutures 12/06/2020   History of COVID-19 09/06/2020   Cough 09/06/2020   Elevated glucose 07/24/2020   Screening and evaluation for vasectomy 07/24/2020   Lateral epicondylitis of right elbow 05/29/2019   Plantar fasciitis, bilateral 05/29/2019   Tobacco abuse disorder 04/28/2018   On Chantix therapy 04/28/2018   PCP:  Mliss Sax, MD Pharmacy:   [facility] [phone] - [place], [place] - [address] [place] [address] Phone: [phone] Fax: [phone]  Social Drivers of Health (SDOH) Social History: SDOH Screenings   Food Insecurity: No Food Insecurity (04/30/2023)  Housing: Low Risk  (04/30/2023)  Transportation Needs: No Transportation Needs (04/30/2023)  Utilities: Not At Risk (04/30/2023)  Alcohol Screen: Low Risk  (02/25/2023)  Depression (PHQ2-9): Low Risk  (02/25/2023)  Physical Activity: Unknown (02/25/2023)  Social Connections: Unknown (02/25/2023)  Stress: No Stress Concern Present (02/25/2023)  Tobacco Use: Medium Risk (04/30/2023)   SDOH Interventions:    Readmission Risk Interventions     No data to display

## 2023-04-30 NOTE — ED Provider Notes (Incomplete)
[place] [facility] DE[facility]MENT AT [facility] Provider Note   CSN: [id number] Arrival date & time: 04/29/23  1745     History {Add pertinent medical, surgical, social history, OB history to HPI:1} Chief Complaint  Patient presents with  . Shortness of Breath    Jason Hurst is a 42 y.o. male.  Patient with symptoms that started one week ago with fever, cough, SOB. Seen by PCP this afternoon and sent to ED for further evaluation. History of COPD, former smoker, using inhaler and nebulizer at home without significant relief. No vomiting. Minimal diarrhea. No further fever after day 1 or 2.   The history is provided by the patient. No language interpreter was used.  Shortness of Breath      Home Medications Prior to Admission medications   Medication Sig Start Date End Date Taking? Authorizing Provider  albuterol (VENTOLIN HFA) 108 (90 Base) MCG/ACT inhaler Inhale 2 puffs into the lungs every 6 (six) hours as needed for wheezing or shortness of breath. 05/25/22   Viviano Simas, FNP  amoxicillin-clavulanate (AUGMENTIN) 875-125 MG tablet Take 1 tablet by mouth 2 (two) times daily. 04/22/23   Waldon Merl, [name]  benzonatate (TESSALON) 200 MG capsule Take 1 capsule (200 mg total) by mouth 2 (two) times daily as needed for cough. 02/25/23   Mliss Sax, MD  budesonide-formoterol [facility]) 160-4.5 MCG/ACT inhaler Inhale 2 puffs into the lungs in the morning and at bedtime. Patient not taking: Reported on 04/29/2023 03/24/23   Luciano Cutter, MD  fluticasone [facility]) 50 MCG/ACT nasal spray Place 2 sprays into both nostrils daily. Patient not taking: Reported on 04/29/2023 09/28/21   Claiborne Rigg, NP  fluticasone [facility]) 50 MCG/ACT nasal spray Place 2 sprays into both nostrils daily. 03/04/23   Mliss Sax, MD      Allergies    Patient has no known allergies.    Review of Systems   Review of Systems  Respiratory:  Positive for  shortness of breath.     Physical Exam Updated Vital Signs BP (!) 145/98   Pulse (!) 132   Temp 98.2 F (36.8 C) (Oral)   Resp 18   SpO2 (!) 88%  Physical Exam Vitals and nursing note reviewed.  Constitutional:      Appearance: He is well-developed.  HENT:     Head: Normocephalic.  Cardiovascular:     Rate and Rhythm: Regular rhythm. Tachycardia present.     Heart sounds: No murmur heard. Pulmonary:     Effort: Pulmonary effort is normal.     Breath sounds: No wheezing, rhonchi or rales.  Abdominal:     General: Bowel sounds are normal.     Palpations: Abdomen is soft.     Tenderness: There is no abdominal tenderness. There is no guarding or rebound.  Musculoskeletal:        General: Normal range of motion.     Cervical back: Normal range of motion and neck supple.  Skin:    General: Skin is warm and dry.  Neurological:     General: No focal deficit present.     Mental Status: He is alert and oriented to person, place, and time.     ED Results / Procedures / Treatments   Labs (all labs ordered are listed, but only abnormal results are displayed) Labs Reviewed  RESP PANEL BY RT-PCR (RSV, FLU A&B, COVID)  [id number]  CBC WITH DIFFERENTIAL/PLATELET  BASIC METABOLIC PANEL   Results for orders  placed or performed during the hospital encounter of 04/29/23  Resp panel by RT-PCR (RSV, Flu A&B, Covid) Anterior Nasal Swab   Collection Time: 04/29/23  7:33 PM   Specimen: Anterior Nasal Swab  Result Value Ref Range   SARS Coronavirus 2 by RT PCR NEGATIVE NEGATIVE   Influenza A by PCR NEGATIVE NEGATIVE   Influenza B by PCR NEGATIVE NEGATIVE   Resp Syncytial Virus by PCR NEGATIVE NEGATIVE   Results for orders placed or performed during the hospital encounter of 04/29/23  Resp panel by RT-PCR (RSV, Flu A&B, Covid) Anterior Nasal Swab   Collection Time: 04/29/23  7:33 PM   Specimen: Anterior Nasal Swab  Result Value Ref Range   SARS Coronavirus 2 by RT PCR NEGATIVE NEGATIVE    Influenza A by PCR NEGATIVE NEGATIVE   Influenza B by PCR NEGATIVE NEGATIVE   Resp Syncytial Virus by PCR NEGATIVE NEGATIVE  CBC with Differential   Collection Time: 04/29/23 10:55 PM  Result Value Ref Range   WBC 13.2 (H) 4.0 - 10.5 K/uL   RBC 4.49 4.22 - 5.81 MIL/uL   Hemoglobin 14.1 13.0 - 17.0 g/dL   HCT 57.8 46.9 - 62.9 %   MCV 91.5 80.0 - 100.0 fL   MCH 31.4 26.0 - 34.0 pg   MCHC 34.3 30.0 - 36.0 g/dL   RDW 52.8 41.3 - 24.4 %   Platelets 256 150 - 400 K/uL   nRBC 0.0 0.0 - 0.2 %   Neutrophils Relative % 79 %   Neutro Abs 10.4 (H) 1.7 - 7.7 K/uL   Lymphocytes Relative 11 %   Lymphs Abs 1.4 0.7 - 4.0 K/uL   Monocytes Relative 9 %   Monocytes Absolute 1.1 (H) 0.1 - 1.0 K/uL   Eosinophils Relative 0 %   Eosinophils Absolute 0.0 0.0 - 0.5 K/uL   Basophils Relative 0 %   Basophils Absolute 0.1 0.0 - 0.1 K/uL   Immature Granulocytes 1 %   Abs Immature Granulocytes 0.16 (H) 0.00 - 0.07 K/uL  Basic metabolic panel   Collection Time: 04/29/23 10:55 PM  Result Value Ref Range   Sodium 136 135 - 145 mmol/L   Potassium 3.9 3.5 - 5.1 mmol/L   Chloride 101 98 - 111 mmol/L   CO2 26 22 - 32 mmol/L   Glucose, Bld 109 (H) 70 - 99 mg/dL   BUN 13 6 - 20 mg/dL   Creatinine, Ser 0.10 0.61 - 1.24 mg/dL   Calcium 9.0 8.9 - 27.2 mg/dL   GFR, Estimated >53 >66 mL/min   Anion gap 9 5 - 15     EKG None  Radiology DG Chest 2 View Result Date: 04/29/2023 CLINICAL DATA:  Shortness of breath over the last couple of days with middle back pain EXAM: CHEST - 2 VIEW COMPARISON:  03/24/2021 and CT chest 09/17/2021 FINDINGS: Subsegmental atelectasis in the left lower lung. The lungs are otherwise clear. Stable cardiomediastinal silhouette. No displaced rib fractures. IMPRESSION: No active cardiopulmonary disease. Electronically Signed   By: Minerva Fester M.D.   On: 04/29/2023 19:40    Procedures Procedures  {Document cardiac monitor, telemetry assessment procedure when  appropriate:1}  Medications Ordered in ED Medications  ipratropium-albuterol (DUONEB) 0.5-2.5 (3) MG/3ML nebulizer solution 3 mL (has no administration in time range)  sodium chloride 0.9 % bolus 1,000 mL (has no administration in time range)  methylPREDNISolone sodium succinate (SOLU-MEDROL) 125 mg/2 mL injection 125 mg (has no administration in time range)    ED  Course/ Medical Decision Making/ A&P   {   Click here for ABCD2, HEART and other calculatorsREFRESH Note before signing :1}                              Medical Decision Making This patient presents to the ED for concern of cough, this involves an extensive number of treatment options, and is a complaint that carries with it a high risk of complications and morbidity.  The differential diagnosis includes COPD exacerbation, PNA, PE, PTX, viral URI, COVID   Co morbidities that complicate the patient evaluation  COPD   Additional history obtained:  Additional history and/or information obtained from chart review, notable for Office visit this morning   Lab Tests:  I Ordered, and personally interpreted labs.  The pertinent results include:  Leukocytosis 13.3; negative viral panel; no electrolyte abnormalities, normal renal function    Imaging Studies ordered:  I ordered imaging studies including CXR I independently visualized and interpreted imaging which showed No acute findings I agree with the radiologist interpretation   Cardiac Monitoring:  The patient was maintained on a cardiac monitor.  I personally viewed and interpreted the cardiac monitored which showed an underlying rhythm of: ***   Medicines ordered and prescription drug management:  I ordered medication including ***  for *** Reevaluation of the patient after these medicines showed that the patient {resolved/improved/worsened:23923::"improved"} I have reviewed the patients home medicines and have made adjustments as needed   Test  Considered:  ***   Critical Interventions:  ***   Consultations Obtained:  I requested consultation with the ***,  and discussed lab and imaging findings as well as pertinent plan - they recommend: ***   Problem List / ED Course:  ***   Reevaluation:  After the interventions noted above, I reevaluated the patient and found that they have :{resolved/improved/worsened:23923::"improved"}   Social Determinants of Health:  ***   Disposition:  After consideration of the diagnostic results and the patients response to treatment, I feel that the patient would benefit from ***.   Amount and/or Complexity of Data Reviewed Labs: ordered. Radiology: ordered.  Risk Prescription drug management.   ***  {Document critical care time when appropriate:1} {Document review of labs and clinical decision tools ie heart score, Chads2Vasc2 etc:1}  {Document your independent review of radiology images, and any outside records:1} {Document your discussion with family members, caretakers, and with consultants:1} {Document social determinants of health affecting pt's care:1} {Document your decision making why or why not admission, treatments were needed:1} Final Clinical Impression(s) / ED Diagnoses Final diagnoses:  None    Rx / DC Orders ED Discharge Orders     None

## 2023-05-01 DIAGNOSIS — R911 Solitary pulmonary nodule: Secondary | ICD-10-CM | POA: Diagnosis not present

## 2023-05-01 DIAGNOSIS — J189 Pneumonia, unspecified organism: Secondary | ICD-10-CM | POA: Diagnosis not present

## 2023-05-01 DIAGNOSIS — J4489 Other specified chronic obstructive pulmonary disease: Secondary | ICD-10-CM | POA: Diagnosis not present

## 2023-05-01 LAB — RESPIRATORY PANEL BY PCR
Adenovirus: NOT DETECTED
Bordetella Parapertussis: NOT DETECTED
Bordetella pertussis: NOT DETECTED
Chlamydophila pneumoniae: NOT DETECTED
Coronavirus 229E: NOT DETECTED
Coronavirus HKU1: NOT DETECTED
Coronavirus NL63: NOT DETECTED
Coronavirus OC43: NOT DETECTED
Influenza A: NOT DETECTED
Influenza B: NOT DETECTED
Metapneumovirus: NOT DETECTED
Mycoplasma pneumoniae: DETECTED — AB
Parainfluenza Virus 1: NOT DETECTED
Parainfluenza Virus 2: NOT DETECTED
Parainfluenza Virus 3: NOT DETECTED
Parainfluenza Virus 4: NOT DETECTED
Respiratory Syncytial Virus: NOT DETECTED
Rhinovirus / Enterovirus: DETECTED — AB

## 2023-05-01 MED ORDER — BUDESONIDE-FORMOTEROL FUMARATE 160-4.5 MCG/ACT IN AERO: 2.0000 | INHALATION_SPRAY | Freq: Two times a day (BID) | RESPIRATORY_TRACT | 12 refills | Status: DC

## 2023-05-01 MED ORDER — ACETAMINOPHEN 325 MG PO TABS: 650.0000 mg | ORAL_TABLET | Freq: Four times a day (QID) | ORAL | Status: DC | PRN

## 2023-05-01 MED ORDER — GUAIFENESIN ER 600 MG PO TB12: 600.0000 mg | ORAL_TABLET | Freq: Two times a day (BID) | ORAL | Status: AC

## 2023-05-01 MED ORDER — PREDNISONE 50 MG PO TABS: 50.0000 mg | ORAL_TABLET | Freq: Every day | ORAL | 0 refills | Status: AC

## 2023-05-01 MED ORDER — IBUPROFEN 200 MG PO TABS
600.0000 mg | ORAL_TABLET | Freq: Once | ORAL | Status: AC
  Administered 2023-05-01: 600 mg via ORAL
  Filled 2023-05-01: qty 1

## 2023-05-01 MED ORDER — AZITHROMYCIN 500 MG PO TABS: 500.0000 mg | ORAL_TABLET | Freq: Every day | ORAL | 0 refills | Status: AC

## 2023-05-01 NOTE — Plan of Care (Signed)
Patient discharged via own private vehicle. AVS and discharge instruction provided. Patient verbalizes understanding. Haydee Salter, RN 05/01/23 11:24 AM

## 2023-05-01 NOTE — Discharge Summary (Signed)
Physician Discharge Summary  Jason Hurst:[id number] DOB: July 06, 1980 DOA: 04/29/2023  PCP: Mliss Sax, MD  Admit date: 04/29/2023 Discharge date: 05/01/2023 Admitted From: Home Disposition: Home Recommendations for Outpatient Follow-up:  Follow up with PCP in 1 week Check CMP and CBC at follow-up Outpatient follow-up with pulmonology for RLL nodule Please follow up on the following pending results: None  Home Health: Not indicated Equipment/Devices: Not indicated  Discharge Condition: Stable CODE STATUS: Full code  Follow-up Information     Mliss Sax, MD. Schedule an appointment as soon as possible for a visit in 1 week(s).   Specialty: Family Medicine Contact information: [address] [place] [address] [phone]                 Hospital course 42 year old M with PMH of asthma/COPD, prior COVID-19 infection and former smoker with 40-pack-year history until 2020 presenting with shortness of breath and cough, and admitted with working diagnosis of atypical pneumonia.  Recently diagnosed with sinusitis and started on p.o. Augmentin that he took for 3 days.    In ED, tachycardic to 120s.  Mild leukocytosis to 13.2 with left shift.  COVID-19, influenza and RSV PCR nonreactive.  CT angio chest negative for PE but mild, ill-defined bilateral tree-in-bud appearing infiltrates concerning for infection and 11 mm noncalcified RLL nodule.  Patient was started on ceftriaxone, Zithromax, steroid and breathing treatments.  Patient's symptoms improved with the above intervention.  RVP positive for mycoplasma pneumonia and rhinovirus.  Antibiotic de-escalated to Zithromax.  He is discharged on Zithromax and p.o. prednisone for 3 more days, and Symbicort and albuterol.  Outpatient follow-up with PCP in 1 to 2 weeks or sooner if needed.  In regards to RLL nodule, pulmonology ordered repeat CT in 3 months and will arrange outpatient  follow-up.  See individual problem list below for more.   Problems addressed during this hospitalization Mycoplasma pneumonia: Presents with SOB and cough.  CT angio chest as above.  RVP positive for mycoplasma and rhinovirus. Received systemic steroid, ceftriaxone and Zithromax for 2 days.  Symptoms improved and he feels well to go home.  Discharged on p.o. prednisone and Zithromax for 3 more days.  Symbicort twice daily.  Albuterol as needed.  Encouraged to maintain good hydration.  Rhinovirus infection: Likely misdiagnosed as acute cystitis when he was seen outpatient. -Supportive care   COPD exacerbation -Management as above   Pulmonary nodule: CT angio chest showed 1.1 cm RLL nodule.  Given patient's smoking history, needs close follow-up.  Notified patient's pulmonologist who has already ordered outpatient follow-up CT chest.  Patient has been notified.   Obesity Body mass index is 32.63 kg/m. -Encourage lifestyle change to lose weight.           Time spent 35 minutes  Vital signs Vitals:   04/30/23 1359 04/30/23 2203 05/01/23 0605 05/01/23 0811  BP: 118/83 (!) 133/92 (!) 133/92   Pulse: 88 79 90   Temp: 98.6 F (37 C) 98.7 F (37.1 C) 98.2 F (36.8 C)   Resp: 18 15 15    Height:      Weight:      SpO2: 90% 95% 93% 93%  TempSrc: Oral     BMI (Calculated):         Discharge exam  GENERAL: No apparent distress.  Nontoxic. HEENT: MMM.  Vision and hearing grossly intact.  NECK: Supple.  No apparent JVD.  RESP:  No IWOB.  Fair aeration bilaterally. CVS:  RRR. Heart sounds normal.  ABD/GI/GU: BS+. Abd soft, NTND.  MSK/EXT:  Moves extremities. No apparent deformity. No edema.  SKIN: no apparent skin lesion or wound NEURO: Awake and alert. Oriented appropriately.  No apparent focal neuro deficit. PSYCH: Calm. Normal affect.   Discharge Instructions Discharge Instructions     Diet general   Complete by: As directed    Discharge instructions   Complete by: As  directed    It has been a pleasure taking care of you!  You were hospitalized due to COPD exacerbation likely from pneumonia and common cold virus.  You have been started on antibiotics, steroid and breathing treatments.  We are discharging you more antibiotics and steroid.  Please review your new medication list and the directions on your medications before you take them.  Follow-up with your primary care doctor in 1 to 2 weeks or sooner if needed.   In regards to your lung nodule, your pulmonologist have ordered repeat CT in about 3 months.  Follow-up with your pulmonologist per the recommendation.    Take care,   Increase activity slowly   Complete by: As directed       Allergies as of 05/01/2023   No Known Allergies      Medication List     STOP taking these medications    amoxicillin-clavulanate 875-125 MG tablet Commonly known as: AUGMENTIN   benzonatate 200 MG capsule Commonly known as: TESSALON       TAKE these medications    albuterol 108 (90 Base) MCG/ACT inhaler Commonly known as: VENTOLIN HFA Inhale 2 puffs into the lungs every 6 (six) hours as needed for wheezing or shortness of breath.   azithromycin 500 MG tablet Commonly known as: Zithromax Take 1 tablet (500 mg total) by mouth daily for 3 days. Start taking on: May 02, 2023   budesonide-formoterol 160-4.5 MCG/ACT inhaler Commonly known as: Symbicort Inhale 2 puffs into the lungs in the morning and at bedtime.   fluticasone 50 MCG/ACT nasal spray Commonly known as: FLONASE Place 2 sprays into both nostrils daily. What changed:  when to take this reasons to take this   guaiFENesin 600 MG 12 hr tablet Commonly known as: MUCINEX Take 1 tablet (600 mg total) by mouth 2 (two) times daily for 5 days.   predniSONE 50 MG tablet Commonly known as: DELTASONE Take 1 tablet (50 mg total) by mouth daily for 3 days. Start taking on: May 02, 2023         Consultations: None  Procedures/Studies:   CT Angio Chest PE W and/or Wo Contrast Result Date: 04/30/2023 CLINICAL DATA:  Shortness of breath and middle back pain. EXAM: CT ANGIOGRAPHY CHEST WITH CONTRAST TECHNIQUE: Multidetector CT imaging of the chest was performed using the standard protocol during bolus administration of intravenous contrast. Multiplanar CT image reconstructions and MIPs were obtained to evaluate the vascular anatomy. RADIATION DOSE REDUCTION: This exam was performed according to the departmental dose-optimization program which includes automated exposure control, adjustment of the mA and/or kV according to patient size and/or use of iterative reconstruction technique. CONTRAST:  75mL OMNIPAQUE IOHEXOL 350 MG/ML SOLN COMPARISON:  Sep 17, 2021 FINDINGS: Cardiovascular: The thoracic aorta is normal in appearance. The segmental and subsegmental pulmonary arteries are limited in evaluation secondary to suboptimal opacification with intravenous contrast. No evidence of pulmonary embolism. Normal heart size. No pericardial effusion. Mediastinum/Nodes: There is mild AP window and bilateral hilar lymphadenopathy. Thyroid gland, trachea, and esophagus demonstrate no significant findings. Lungs/Pleura: Mild,  ill-defined tree-in-bud appearing infiltrates are seen throughout both lungs. This is slightly more prominent within the right middle lobe and right lower lobe. An 11 mm non calcified lung nodule is seen within the right lower lobe (axial CT image 95, CT series 12). This is not clearly identified on the prior study. No pleural effusion or pneumothorax is identified. Upper Abdomen: There is a small hiatal hernia. Musculoskeletal: No chest wall abnormality. No acute or significant osseous findings. Review of the MIP images confirms the above findings. IMPRESSION: 1. No evidence of pulmonary embolism. 2. Mild, ill-defined bilateral tree-in-bud appearing infiltrates, likely infectious in  etiology. 3. 11 mm non calcified right lower lobe lung nodule. Consider one of the following in 3 months for both low-risk and high-risk individuals: (a) repeat chest CT, (b) follow-up PET-CT, or (c) tissue sampling. This recommendation follows the consensus statement: Guidelines for Management of Incidental Pulmonary Nodules Detected on CT Images: From the Fleischner Society 2017; Radiology 2017; 284:228-243. 4. Mild AP window and bilateral hilar lymphadenopathy, likely reactive in nature. 5. Small hiatal hernia. Electronically Signed   By: Aram Candela M.D.   On: 04/30/2023 00:48   DG Chest 2 View Result Date: 04/29/2023 CLINICAL DATA:  Shortness of breath over the last couple of days with middle back pain EXAM: CHEST - 2 VIEW COMPARISON:  03/24/2021 and CT chest 09/17/2021 FINDINGS: Subsegmental atelectasis in the left lower lung. The lungs are otherwise clear. Stable cardiomediastinal silhouette. No displaced rib fractures. IMPRESSION: No active cardiopulmonary disease. Electronically Signed   By: Minerva Fester M.D.   On: 04/29/2023 19:40       The results of significant diagnostics from this hospitalization (including imaging, microbiology, ancillary and laboratory) are listed below for reference.     Microbiology: Recent Results (from the past 240 hours)  Resp panel by RT-PCR (RSV, Flu A&B, Covid) Anterior Nasal Swab     Status: None   Collection Time: 04/29/23  7:33 PM   Specimen: Anterior Nasal Swab  Result Value Ref Range Status   SARS Coronavirus 2 by RT PCR NEGATIVE NEGATIVE Final    Comment: (NOTE) SARS-CoV-2 target nucleic acids are NOT DETECTED.  The SARS-CoV-2 RNA is generally detectable in upper respiratory specimens during the acute phase of infection. The lowest concentration of SARS-CoV-2 viral copies this assay can detect is 138 copies/mL. A negative result does not preclude SARS-Cov-2 infection and should not be used as the sole basis for treatment or other  patient management decisions. A negative result may occur with  improper specimen collection/handling, submission of specimen other than nasopharyngeal swab, presence of viral mutation(s) within the areas targeted by this assay, and inadequate number of viral copies(<138 copies/mL). A negative result must be combined with clinical observations, patient history, and epidemiological information. The expected result is Negative.  Fact Sheet for Patients:  BloggerCourse.com  Fact Sheet for Healthcare Providers:  SeriousBroker.it  This test is no t yet approved or cleared by the [place] FDA and  has been authorized for detection and/or diagnosis of SARS-CoV-2 by FDA under an Emergency Use Authorization (EUA). This EUA will remain  in effect (meaning this test can be used) for the duration of the COVID-19 declaration under Section 564(b)(1) of the Act, 21 U.S.C.section 360bbb-3(b)(1), unless the authorization is terminated  or revoked sooner.       Influenza A by PCR NEGATIVE NEGATIVE Final   Influenza B by PCR NEGATIVE NEGATIVE Final    Comment: (NOTE) The Xpert Xpress SARS-CoV-2/FLU/RSV plus  assay is intended as an aid in the diagnosis of influenza from Nasopharyngeal swab specimens and should not be used as a sole basis for treatment. Nasal washings and aspirates are unacceptable for Xpert Xpress SARS-CoV-2/FLU/RSV testing.  Fact Sheet for Patients: BloggerCourse.com  Fact Sheet for Healthcare Providers: SeriousBroker.it  This test is not yet approved or cleared by the [place] FDA and has been authorized for detection and/or diagnosis of SARS-CoV-2 by FDA under an Emergency Use Authorization (EUA). This EUA will remain in effect (meaning this test can be used) for the duration of the COVID-19 declaration under Section 564(b)(1) of the Act, 21 U.S.C. section  360bbb-3(b)(1), unless the authorization is terminated or revoked.     Resp Syncytial Virus by PCR NEGATIVE NEGATIVE Final    Comment: (NOTE) Fact Sheet for Patients: BloggerCourse.com  Fact Sheet for Healthcare Providers: SeriousBroker.it  This test is not yet approved or cleared by the [place] FDA and has been authorized for detection and/or diagnosis of SARS-CoV-2 by FDA under an Emergency Use Authorization (EUA). This EUA will remain in effect (meaning this test can be used) for the duration of the COVID-19 declaration under Section 564(b)(1) of the Act, 21 U.S.C. section 360bbb-3(b)(1), unless the authorization is terminated or revoked.  Performed at [facility], [address] [place], [place] [address]   Respiratory (~20 pathogens) panel by PCR     Status: Abnormal   Collection Time: 04/29/23  7:33 PM   Specimen: Nasopharyngeal Swab; Respiratory  Result Value Ref Range Status   Adenovirus NOT DETECTED NOT DETECTED Final   Coronavirus 229E NOT DETECTED NOT DETECTED Final    Comment: (NOTE) The Coronavirus on the Respiratory Panel, DOES NOT test for the novel  Coronavirus (2019 nCoV)    Coronavirus HKU1 NOT DETECTED NOT DETECTED Final   Coronavirus NL63 NOT DETECTED NOT DETECTED Final   Coronavirus OC43 NOT DETECTED NOT DETECTED Final   Metapneumovirus NOT DETECTED NOT DETECTED Final   Rhinovirus / Enterovirus DETECTED (A) NOT DETECTED Final   Influenza A NOT DETECTED NOT DETECTED Final   Influenza B NOT DETECTED NOT DETECTED Final   Parainfluenza Virus 1 NOT DETECTED NOT DETECTED Final   Parainfluenza Virus 2 NOT DETECTED NOT DETECTED Final   Parainfluenza Virus 3 NOT DETECTED NOT DETECTED Final   Parainfluenza Virus 4 NOT DETECTED NOT DETECTED Final   Respiratory Syncytial Virus NOT DETECTED NOT DETECTED Final   Bordetella pertussis NOT DETECTED NOT DETECTED Final   Bordetella  Parapertussis NOT DETECTED NOT DETECTED Final   Chlamydophila pneumoniae NOT DETECTED NOT DETECTED Final   Mycoplasma pneumoniae DETECTED (A) NOT DETECTED Final    Comment: Performed at [facility], [address], [place], [place] [address]  Expectorated Sputum Assessment w Gram Stain, Rflx to Resp Cult     Status: None   Collection Time: 04/30/23  2:29 AM   Specimen: Expectorated Sputum  Result Value Ref Range Status   Specimen Description EXPECTORATED SPUTUM  Final   Special Requests Normal  Final   Sputum evaluation   Final    THIS SPECIMEN IS ACCEPTABLE FOR SPUTUM CULTURE Performed at [facility] [address] [place], [place] [address]    Report Status 04/30/2023 FINAL  Final  Culture, Respiratory w Gram Stain     Status: None (Preliminary result)   Collection Time: 04/30/23  2:29 AM  Result Value Ref Range Status   Specimen Description   Final    EXPECTORATED SPUTUM  Performed at [facility], [address], [place], [place] [address]    Special Requests   Final    Normal Reflexed from [id number] Performed at [facility], [address], [place], [place] [address]    Gram Stain   Final    FEW WBC PRESENT, PREDOMINANTLY PMN FEW GRAM POSITIVE COCCI    Culture   Final    CULTURE REINCUBATED FOR BETTER GROWTH Performed at [facility], [address], [place], [place] [address]    Report Status PENDING  Incomplete     Labs:  CBC: Recent Labs  Lab 04/29/23 2255 04/30/23 0829  WBC 13.2* 13.4*  NEUTROABS 10.4* 11.9*  HGB 14.1 13.7  HCT 41.1 40.5  MCV 91.5 91.6  PLT 256 222   BMP &GFR Recent Labs  Lab 04/29/23 2255 04/30/23 0829  NA 136 136  K 3.9 4.2  CL 101 102  CO2 26 26  GLUCOSE 109* 140*  BUN 13 12  CREATININE 0.92 0.85  CALCIUM 9.0 9.0  MG  --  2.3  PHOS  --  4.3   Estimated Creatinine Clearance: 138.9 mL/min (by C-G formula based on SCr of 0.85 mg/dL). Liver &  Pancreas: Recent Labs  Lab 04/30/23 0829  ALBUMIN 3.3*   No results for input(s): "LIPASE", "AMYLASE" in the last 168 hours. No results for input(s): "AMMONIA" in the last 168 hours. Diabetic: No results for input(s): "HGBA1C" in the last 72 hours. No results for input(s): "GLUCAP" in the last 168 hours. Cardiac Enzymes: No results for input(s): "CKTOTAL", "CKMB", "CKMBINDEX", "TROPONINI" in the last 168 hours. No results for input(s): "PROBNP" in the last 8760 hours. Coagulation Profile: No results for input(s): "INR", "PROTIME" in the last 168 hours. Thyroid Function Tests: No results for input(s): "TSH", "T4TOTAL", "FREET4", "T3FREE", "THYROIDAB" in the last 72 hours. Lipid Profile: No results for input(s): "CHOL", "HDL", "LDLCALC", "TRIG", "CHOLHDL", "LDLDIRECT" in the last 72 hours. Anemia Panel: No results for input(s): "VITAMINB12", "FOLATE", "FERRITIN", "TIBC", "IRON", "RETICCTPCT" in the last 72 hours. Urine analysis:    Component Value Date/Time   COLORURINE YELLOW 07/24/2020 0953   APPEARANCEUR CLEAR 07/24/2020 0953   LABSPEC 1.020 07/24/2020 0953   PHURINE 7.5 07/24/2020 0953   GLUCOSEU NEGATIVE 07/24/2020 0953   HGBUR NEGATIVE 07/24/2020 0953   BILIRUBINUR NEGATIVE 07/24/2020 0953   KETONESUR NEGATIVE 07/24/2020 0953   UROBILINOGEN 0.2 07/24/2020 0953   NITRITE NEGATIVE 07/24/2020 0953   LEUKOCYTESUR NEGATIVE 07/24/2020 0953   Sepsis Labs: Invalid input(s): "PROCALCITONIN", "LACTICIDVEN"   SIGNED:  Almon Hercules, MD  [facility] 05/01/2023, 4:25 PM

## 2023-05-03 ENCOUNTER — Telehealth: Payer: Self-pay

## 2023-05-03 LAB — CULTURE, RESPIRATORY W GRAM STAIN: Special Requests: NORMAL

## 2023-05-06 ENCOUNTER — Ambulatory Visit: Payer: 59 | Admitting: Family Medicine

## 2023-05-06 ENCOUNTER — Encounter: Payer: Self-pay | Admitting: Family Medicine

## 2023-05-06 VITALS — BP 116/70 | HR 94 | Temp 97.4°F | Ht 70.0 in | Wt 237.0 lb

## 2023-05-06 DIAGNOSIS — Z23 Encounter for immunization: Secondary | ICD-10-CM | POA: Diagnosis not present

## 2023-05-06 DIAGNOSIS — R911 Solitary pulmonary nodule: Secondary | ICD-10-CM

## 2023-05-06 DIAGNOSIS — K449 Diaphragmatic hernia without obstruction or gangrene: Secondary | ICD-10-CM | POA: Diagnosis not present

## 2023-05-06 DIAGNOSIS — Z09 Encounter for follow-up examination after completed treatment for conditions other than malignant neoplasm: Secondary | ICD-10-CM | POA: Insufficient documentation

## 2023-05-06 DIAGNOSIS — J4489 Other specified chronic obstructive pulmonary disease: Secondary | ICD-10-CM

## 2023-05-06 NOTE — Transitions of Care (Post Inpatient/ED Visit) (Signed)
   05/06/2023  Name: Jason Hurst MRN: [id number] DOB: 11-15-1980  Today's TOC FU Call Status:    Attempted to reach the patient regarding the most recent Inpatient/ED visit.  Follow Up Plan: Additional outreach attempts will be made to reach the patient to complete the Transitions of Care (Post Inpatient/ED visit) call.   Signature Dedra Sorenson, BSN, [facility]

## 2023-05-06 NOTE — Progress Notes (Signed)
 Established Patient Office Visit   Subjective:  Patient ID: Jason Hurst, male    DOB: [birth year]  Age: 43 y.o. MRN: [id number]  Chief Complaint  Patient presents with   Hospitalization Follow-up    Pneumonia. Pt states he is doing well. Has questions about a hernis that was meantion at the ER.    HPI Encounter Diagnoses  Name Primary?   COPD with asthma (HCC) Yes   Pulmonary nodule 1 cm or greater in diameter    Need for immunization against influenza    Hospital discharge follow-up    Hiatal hernia    Status post hospitalization for acute exacerbation of asthma/COPD.  Probable pneumonia.  He has completed his course of therapy with antibiotics.  Breathing has improved greatly.  O2 sats are up to 100%.  Cough remains productive.  He is taking Mucinex .  No further fevers.  CT angiogram showed evidence for pulmonary infiltrates and an 11 mm nodule in the right lower lobe.  There was a small hiatal hernia.  He does deal with indigestion with certain inciting foods such as spicy foods and tomato-based sauces.  He uses an OTC PPI as needed.  He is no longer smoking tobacco or marijuana.   Review of Systems  Constitutional: Negative.  Negative for chills and fever.  HENT: Negative.    Eyes:  Negative for blurred vision, discharge and redness.  Respiratory:  Positive for cough and sputum production. Negative for shortness of breath and wheezing.   Cardiovascular: Negative.   Gastrointestinal:  Negative for abdominal pain.  Genitourinary: Negative.   Musculoskeletal: Negative.  Negative for myalgias.  Skin:  Negative for rash.  Neurological:  Negative for tingling, loss of consciousness and weakness.  Endo/Heme/Allergies:  Negative for polydipsia.     Current Outpatient Medications:    albuterol  (VENTOLIN  HFA) 108 (90 Base) MCG/ACT inhaler, Inhale 2 puffs into the lungs every 6 (six) hours as needed for wheezing or shortness of breath., Disp: 8 g, Rfl: 0   budesonide -formoterol   (SYMBICORT ) 160-4.5 MCG/ACT inhaler, Inhale 2 puffs into the lungs in the morning and at bedtime., Disp: 1 each, Rfl: 12   fluticasone  (FLONASE ) 50 MCG/ACT nasal spray, Place 2 sprays into both nostrils daily. (Patient taking differently: Place 2 sprays into both nostrils as needed for allergies or rhinitis.), Disp: 16 g, Rfl: 6   guaiFENesin  (MUCINEX ) 600 MG 12 hr tablet, Take 1 tablet (600 mg total) by mouth 2 (two) times daily for 5 days., Disp: , Rfl:    Objective:     BP 116/70   Pulse 94   Temp (!) 97.4 F (36.3 C)   Ht 5' 10 (1.778 m)   Wt 237 lb (107.5 kg)   SpO2 100%   BMI 34.01 kg/m    Physical Exam Constitutional:      General: He is not in acute distress.    Appearance: Normal appearance. He is not ill-appearing, toxic-appearing or diaphoretic.  HENT:     Head: Normocephalic and atraumatic.     Right Ear: External ear normal.     Left Ear: External ear normal.     Mouth/Throat:     Mouth: Mucous membranes are moist.     Pharynx: Oropharynx is clear. No oropharyngeal exudate or posterior oropharyngeal erythema.  Eyes:     General: No scleral icterus.       Right eye: No discharge.        Left eye: No discharge.     Extraocular Movements:  Extraocular movements intact.     Conjunctiva/sclera: Conjunctivae normal.     Pupils: Pupils are equal, round, and reactive to light.  Cardiovascular:     Rate and Rhythm: Normal rate and regular rhythm.  Pulmonary:     Effort: Pulmonary effort is normal. No respiratory distress.     Breath sounds: Decreased air movement present. Rhonchi present. No wheezing or rales.  Abdominal:     General: Bowel sounds are normal.     Tenderness: There is no abdominal tenderness. There is no guarding.  Musculoskeletal:     Cervical back: No rigidity or tenderness.  Skin:    General: Skin is warm and dry.  Neurological:     Mental Status: He is alert and oriented to person, place, and time.  Psychiatric:        Mood and Affect: Mood  normal.        Behavior: Behavior normal.      No results found for any visits on 05/06/23.    The 10-year ASCVD risk score (Arnett DK, et al., 2019) is: 0.7%    Assessment & Plan:   COPD with asthma (HCC)  Pulmonary nodule 1 cm or greater in diameter  Need for immunization against influenza -     Flu vaccine trivalent PF, 6mos and older(Flulaval,Afluria,Fluarix,Fluzone)  Hospital discharge follow-up  Hiatal hernia    Return if symptoms worsen or fail to improve.  Follow-up CT for pulmonary nodule is in March.  Follow-up with pulmonology is in March as well.  Will continue Mucinex .  Flu shot today.  Advised that he uses inhalers as directed.  Information was given on hiatal hernia.  Follow-up with pulmonologist and for follow-up CT of chest as planned  Elsie Sim Lent, MD

## 2023-05-12 NOTE — Transitions of Care (Post Inpatient/ED Visit) (Signed)
   05/12/2023  Name: Jason Hurst MRN: [id number] DOB: [birth year]  Today's TOC FU Call Status: Today's TOC FU Call Status:: Unsuccessful Call (2nd Attempt) Unsuccessful Call (2nd Attempt) Date: 05/12/23  Attempted to reach the patient regarding the most recent Inpatient/ED visit.  Follow Up Plan: No further outreach attempts will be made at this time. We have been unable to contact the patient.  Signature Dedra Sorenson, BSN, [employer] [employer]

## 2023-07-14 ENCOUNTER — Ambulatory Visit (HOSPITAL_COMMUNITY)
Admission: RE | Admit: 2023-07-14 | Discharge: 2023-07-14 | Disposition: A | Discharge status: Home / Self Care | Source: Ambulatory Visit | Attending: Pulmonary Disease | Admitting: Pulmonary Disease

## 2023-07-14 DIAGNOSIS — J439 Emphysema, unspecified: Secondary | ICD-10-CM | POA: Diagnosis not present

## 2023-07-14 DIAGNOSIS — R911 Solitary pulmonary nodule: Secondary | ICD-10-CM | POA: Diagnosis not present

## 2023-07-14 DIAGNOSIS — K449 Diaphragmatic hernia without obstruction or gangrene: Secondary | ICD-10-CM | POA: Diagnosis not present

## 2023-07-26 ENCOUNTER — Encounter: Payer: Self-pay | Admitting: Nurse Practitioner

## 2023-07-26 ENCOUNTER — Ambulatory Visit: Payer: 59 | Admitting: Nurse Practitioner

## 2023-07-26 ENCOUNTER — Telehealth: Payer: Self-pay | Admitting: *Deleted

## 2023-07-26 VITALS — BP 120/84 | HR 87 | Ht 70.0 in | Wt 254.2 lb

## 2023-07-26 DIAGNOSIS — R911 Solitary pulmonary nodule: Secondary | ICD-10-CM | POA: Diagnosis not present

## 2023-07-26 DIAGNOSIS — J4489 Other specified chronic obstructive pulmonary disease: Secondary | ICD-10-CM | POA: Diagnosis not present

## 2023-07-26 NOTE — Assessment & Plan Note (Signed)
 Stable on current regimen. Last exacerbation 04/2023 secondary to mycoplasma pneumonia and rhinovirus. Recovered well and clinically improved. Continue ICS/LABA therapy. Trigger prevention reviewed. Action plan in place.  Patient Instructions  Continue Albuterol inhaler 2 puffs or 3 mL neb every 6 hours as needed for shortness of breath or wheezing. Notify if symptoms persist despite rescue inhaler/neb use.  Continue Symbicort 2 puffs Twice daily. Brush tongue and rinse mouth afterwards  Continue flonase nasal spray 2 sprays each nostril as needed for congestion/postnasal drainage Continue daily allergy pill   I'll call you about your CT scan results and next steps  Follow up in 6 months with Dr. Everardo All (1st) or Katie Shivank Pinedo,NP. If symptoms worsen, please contact office for sooner follow up or seek emergency care.

## 2023-07-26 NOTE — Telephone Encounter (Signed)
 ATC Reading room at [facility], left detailed message that we needed a CT chest read from 3/12 for this patient.  I left my direct # to call back, [phone].

## 2023-07-26 NOTE — Assessment & Plan Note (Signed)
 Likely infectious/inflammatory. Awaiting CT chest from 3/12. Will determine if further dedicated follow up warranted once results reviewed. Reading room contacted.

## 2023-07-26 NOTE — Progress Notes (Unsigned)
 @Patient  ID: Jason Hurst, male    DOB: 1980-12-28, 43 y.o.   MRN: [id number]  Chief Complaint  Patient presents with   Follow-up    Patient is doing well    Referring provider: Mliss Sax  HPI: 43 year old male, former smoker followed for COPD/asthma, COVID long hauler, and lung nodule. He is a patient of Dr. George Hugh and last seen in office 01/08/2023. Past medical history significant for GERD, HTN.   TEST/EVENTS:  09/22/2021 PFT: FVC 70, FEV1 56, ratio 71, TLC 101, DLCOcor 87. Positive BD 04/30/2023 CTA chest: no PE. Mild AP window and b/l hilar adenopathy. Mild, ill-defined tree in bud appearing infiltrates throughout both lungs. Slight more prominent in RML and RLL. 11 mm non calcified lung nodule in the RLL. Small hiatal hernia.   01/08/2023: OV with Dr. Everardo All. Treated for sinus infection since last visit. Breathing well now but was sick for a month. Uses flonase for nasal congestion. No issues with cough or SOB. Has been out of Advair for 2-3 days. Worried his inhaler is contributing to his sinus infections. Reassured this is less likely but changed inhaler style. Switch to Advair 230 mcg HFA 2 puffs bid.   07/26/2023: Today - follow up Patient presents today for follow up. He had recent repeat CT chest, which results are not available for. He had a 1 cm nodule during CTA chest 04/2023 in the ED, during which time he was also diagnosed with pna and treated with z pack. He had mycoplasma pneumonia and rhinovirus. His little boy was also sick at the time.  He tells me he is better and back to his normal. He feels like his breathing has been doing well. He's using generic Symbicort 160 mcg Twice daily. Does not have to use his albuterol. Sinuses have been doing well. No cough, wheezing, chest congestion. No hemoptysis, fevers, chills, night sweats, anorexia, weight loss.   No Known Allergies  Immunization History  Administered Date(s) Administered   Influenza,  Seasonal, Injecte, Preservative Fre 05/06/2023   Tdap 11/26/2020    Past Medical History:  Diagnosis Date   COPD (chronic obstructive pulmonary disease) (HCC)    COVID-19 long hauler     Tobacco History: Social History   Tobacco Use  Smoking Status Former   Current packs/day: 0.00   Average packs/day: 1.5 packs/day for 22.0 years (33.0 ttl pk-yrs)   Types: Cigarettes   Start date: 05/05/1996   Quit date: 05/05/2018   Years since quitting: 5.2   Passive exposure: Past  Smokeless Tobacco Never   Counseling given: Not Answered   Outpatient Medications Prior to Visit  Medication Sig Dispense Refill   albuterol (VENTOLIN HFA) 108 (90 Base) MCG/ACT inhaler Inhale 2 puffs into the lungs every 6 (six) hours as needed for wheezing or shortness of breath. 8 g 0   budesonide-formoterol (SYMBICORT) 160-4.5 MCG/ACT inhaler Inhale 2 puffs into the lungs in the morning and at bedtime. 1 each 12   fluticasone (FLONASE) 50 MCG/ACT nasal spray Place 2 sprays into both nostrils daily. (Patient taking differently: Place 2 sprays into both nostrils as needed for allergies or rhinitis.) 16 g 6   No facility-administered medications prior to visit.     Review of Systems:   Constitutional: No weight loss or gain, night sweats, fevers, chills, fatigue, or lassitude. HEENT: No headaches, difficulty swallowing, tooth/dental problems, or sore throat. No sneezing, itching, ear ache, nasal congestion, or post nasal drip CV:  No chest pain,  orthopnea, PND, swelling in lower extremities, anasarca, dizziness, palpitations, syncope Resp: No shortness of breath with exertion or at rest. No excess mucus or change in color of mucus. No productive or non-productive. No hemoptysis. No wheezing.  No chest wall deformity GI:  No heartburn, indigestion, loss of appetite  MSK:  No joint pain or swelling.  Neuro: No dizziness or lightheadedness.  Psych: No depression or anxiety. Mood stable.     Physical  Exam:  BP 120/84 (BP Location: Left Arm, Patient Position: Sitting, Cuff Size: Normal)   Pulse 87   Ht 5\' 10"  (1.778 m)   Wt 254 lb 3.2 oz (115.3 kg)   SpO2 96%   BMI 36.47 kg/m   GEN: Pleasant, interactive, well-appearing; obese; in no acute distress HEENT:  Normocephalic and atraumatic. PERRLA. Sclera white. Nasal turbinates pink, moist and patent bilaterally. No rhinorrhea present. Oropharynx pink and moist, without exudate or edema. No lesions, ulcerations, or postnasal drip.  NECK:  Supple w/ fair ROM. Thyroid symmetrical with no goiter or nodules palpated. No lymphadenopathy.   CV: RRR, no m/r/g, no peripheral edema. Pulses intact, +2 bilaterally. No cyanosis, pallor or clubbing. PULMONARY:  Unlabored, regular breathing. Clear bilaterally A&P w/o wheezes/rales/rhonchi. No accessory muscle use.  GI: BS present and normoactive. Soft, non-tender to palpation. No organomegaly or masses detected.  MSK: No erythema, warmth or tenderness. Cap refil <2 sec all extrem. No deformities or joint swelling noted.  Neuro: A/Ox3. No focal deficits noted.   Skin: Warm, no lesions or rashe Psych: Normal affect and behavior. Judgement and thought content appropriate.     Lab Results:  CBC    Component Value Date/Time   WBC 13.4 (H) 04/30/2023 0829   RBC 4.42 04/30/2023 0829   HGB 13.7 04/30/2023 0829   HCT 40.5 04/30/2023 0829   PLT 222 04/30/2023 0829   MCV 91.6 04/30/2023 0829   MCH 31.0 04/30/2023 0829   MCHC 33.8 04/30/2023 0829   RDW 12.6 04/30/2023 0829   LYMPHSABS 1.0 04/30/2023 0829   MONOABS 0.3 04/30/2023 0829   EOSABS 0.0 04/30/2023 0829   BASOSABS 0.0 04/30/2023 0829    BMET    Component Value Date/Time   NA 136 04/30/2023 0829   K 4.2 04/30/2023 0829   CL 102 04/30/2023 0829   CO2 26 04/30/2023 0829   GLUCOSE 140 (H) 04/30/2023 0829   BUN 12 04/30/2023 0829   CREATININE 0.85 04/30/2023 0829   CALCIUM 9.0 04/30/2023 0829   GFRNONAA >60 04/30/2023 0829   GFRAA >60  02/01/2018 0220    BNP No results found for: "BNP"   Imaging:  No results found.  Administration History     None          Latest Ref Rng & Units 09/22/2021    2:41 PM  PFT Results  FVC-Pre L 3.72   FVC-Predicted Pre % 70   FVC-Post L 4.20   FVC-Predicted Post % 79   Pre FEV1/FVC % % 65   Post FEV1/FCV % % 71   FEV1-Pre L 2.42   FEV1-Predicted Pre % 56   FEV1-Post L 2.96   DLCO uncorrected ml/min/mmHg 27.27   DLCO UNC% % 87   DLCO corrected ml/min/mmHg 27.27   DLCO COR %Predicted % 87   DLVA Predicted % 93   TLC L 7.00   TLC % Predicted % 101   RV % Predicted % 164     No results found for: "NITRICOXIDE"      Assessment &  Plan:   COPD with asthma (HCC) Stable on current regimen. Last exacerbation 04/2023 secondary to mycoplasma pneumonia and rhinovirus. Recovered well and clinically improved. Continue ICS/LABA therapy. Trigger prevention reviewed. Action plan in place.  Patient Instructions  Continue Albuterol inhaler 2 puffs or 3 mL neb every 6 hours as needed for shortness of breath or wheezing. Notify if symptoms persist despite rescue inhaler/neb use.  Continue Symbicort 2 puffs Twice daily. Brush tongue and rinse mouth afterwards  Continue flonase nasal spray 2 sprays each nostril as needed for congestion/postnasal drainage Continue daily allergy pill   I'll call you about your CT scan results and next steps  Follow up in 6 months with Dr. Everardo All (1st) or Katie Wallis Vancott,NP. If symptoms worsen, please contact office for sooner follow up or seek emergency care.    Pulmonary nodule 1 cm or greater in diameter Likely infectious/inflammatory. Awaiting CT chest from 3/12. Will determine if further dedicated follow up warranted once results reviewed. Reading room contacted.    Advised if symptoms do not improve or worsen, to please contact office for sooner follow up or seek emergency care.   I spent 32 minutes of dedicated to the care of this patient on  the date of this encounter to include pre-visit review of records, face-to-face time with the patient discussing conditions above, post visit ordering of testing, clinical documentation with the electronic health record, making appropriate referrals as documented, and communicating necessary findings to members of the patients care team.  Noemi Chapel, NP 07/26/2023  Pt aware and understands NP's role.

## 2023-07-26 NOTE — Patient Instructions (Addendum)
 Continue Albuterol inhaler 2 puffs or 3 mL neb every 6 hours as needed for shortness of breath or wheezing. Notify if symptoms persist despite rescue inhaler/neb use.  Continue Symbicort 2 puffs Twice daily. Brush tongue and rinse mouth afterwards  Continue flonase nasal spray 2 sprays each nostril as needed for congestion/postnasal drainage Continue daily allergy pill   I'll call you about your CT scan results and next steps  Follow up in 6 months with Dr. Everardo All (1st) or Katie Heliodoro Domagalski,NP. If symptoms worsen, please contact office for sooner follow up or seek emergency care.

## 2023-07-27 ENCOUNTER — Encounter: Payer: Self-pay | Admitting: Nurse Practitioner

## 2023-07-27 ENCOUNTER — Encounter: Payer: Self-pay | Admitting: *Deleted

## 2023-07-27 NOTE — Progress Notes (Signed)
 07/27/2023 Addendum: CT chest showed resolved tree in bud opacities and RLL nodule, indicating prior infectious/inflammatory process. No dedicated follow up necessary. He may be a candidate for lung cancer screening once he is age 43 given smoking history.

## 2023-07-27 NOTE — Telephone Encounter (Signed)
 CT chest looked much better. The prior nodule has resolved. No dedicated follow up necessary. Thanks!   Msg sent to pt letting him know results via mychart.

## 2023-07-29 ENCOUNTER — Other Ambulatory Visit (HOSPITAL_BASED_OUTPATIENT_CLINIC_OR_DEPARTMENT_OTHER)

## 2023-09-17 ENCOUNTER — Encounter: Admitting: Family Medicine

## 2023-09-21 ENCOUNTER — Encounter: Admitting: Family Medicine

## 2023-09-25 ENCOUNTER — Ambulatory Visit (HOSPITAL_BASED_OUTPATIENT_CLINIC_OR_DEPARTMENT_OTHER)
Admission: RE | Admit: 2023-09-25 | Discharge: 2023-09-25 | Disposition: A | Discharge status: Home / Self Care | Source: Ambulatory Visit | Attending: Family Medicine | Admitting: Family Medicine

## 2023-09-25 ENCOUNTER — Encounter (HOSPITAL_BASED_OUTPATIENT_CLINIC_OR_DEPARTMENT_OTHER): Payer: Self-pay

## 2023-09-25 VITALS — BP 116/78 | HR 68 | Temp 97.8°F | Resp 20

## 2023-09-25 DIAGNOSIS — J0100 Acute maxillary sinusitis, unspecified: Secondary | ICD-10-CM

## 2023-09-25 MED ORDER — AMOXICILLIN-POT CLAVULANATE 875-125 MG PO TABS: 1.0000 | ORAL_TABLET | Freq: Two times a day (BID) | ORAL | 0 refills | Status: DC

## 2023-09-25 NOTE — ED Provider Notes (Signed)
 [facility]    CSN: [id number] Arrival date & time: 09/25/23  0858      History   Chief Complaint Chief Complaint  Patient presents with   Cough    Congested/ possible sinus infections - Entered by patient    HPI Jason Hurst is a 43 y.o. male.   Patient is a 43 year old male that presents today with nasal congestion, sinus pressure for approximately 3 weeks.  Concern due to symptoms worsening yesterday with onset of sore throat, cough.  He did take Mucinex  which seemed to help some of his symptoms.  History of pneumonia.  Denies any fevers, chills, body aches or night sweats.   Cough   Past Medical History:  Diagnosis Date   COPD (chronic obstructive pulmonary disease) (HCC)    COVID-19 long hauler     Patient Active Problem List   Diagnosis Date Noted   Need for immunization against influenza 05/06/2023   Hospital discharge follow-up 05/06/2023   Atypical pneumonia 04/30/2023   Pulmonary nodule 1 cm or greater in diameter 04/30/2023   Community acquired pneumonia 04/30/2023   Sore throat 06/10/2022   Chest pain 06/10/2022   Gastroesophageal reflux disease 02/05/2022   Elevated BP without diagnosis of hypertension 02/05/2022   Moderate persistent asthma without complication 09/22/2021   COPD with asthma (HCC) 09/22/2021   COVID-19 long hauler manifesting chronic dyspnea 06/17/2021   Encounter for removal of sutures 12/06/2020   History of COVID-19 09/06/2020   Cough 09/06/2020   Elevated glucose 07/24/2020   Screening and evaluation for vasectomy 07/24/2020   Lateral epicondylitis of right elbow 05/29/2019   Plantar fasciitis, bilateral 05/29/2019   Tobacco abuse disorder 04/28/2018   On Chantix  therapy 04/28/2018    History reviewed. No pertinent surgical history.     Home Medications    Prior to Admission medications   Medication Sig Start Date End Date Taking? Authorizing Provider  amoxicillin -clavulanate (AUGMENTIN ) 875-125 MG  tablet Take 1 tablet by mouth every 12 (twelve) hours. 09/25/23  Yes Dino Borntreger A, FNP  albuterol  (VENTOLIN  HFA) 108 (90 Base) MCG/ACT inhaler Inhale 2 puffs into the lungs every 6 (six) hours as needed for wheezing or shortness of breath. 05/25/22   Mardene Shake, FNP  budesonide -formoterol  (SYMBICORT ) 160-4.5 MCG/ACT inhaler Inhale 2 puffs into the lungs in the morning and at bedtime. 05/01/23   Gonfa, Taye T, MD  fluticasone  (FLONASE ) 50 MCG/ACT nasal spray Place 2 sprays into both nostrils daily. Patient taking differently: Place 2 sprays into both nostrils as needed for allergies or rhinitis. 03/04/23   Tonna Frederic, MD    Family History Family History  Problem Relation Age of Onset   Healthy Mother    Diabetes Father    Heart disease Father    Healthy Sister    Healthy Brother    Heart attack Maternal Uncle     Social History Social History   Tobacco Use   Smoking status: Former    Current packs/day: 0.00    Average packs/day: 1.5 packs/day for 22.0 years (33.0 ttl pk-yrs)    Types: Cigarettes    Start date: 05/05/1996    Quit date: 05/05/2018    Years since quitting: 5.3    Passive exposure: Past   Smokeless tobacco: Never  Vaping Use   Vaping status: Never Used  Substance Use Topics   Alcohol use: Yes    Comment: very occasionally.    Drug use: Yes    Types: Marijuana  Allergies   Patient has no known allergies.   Review of Systems Review of Systems  Respiratory:  Positive for cough.     See HPI Physical Exam Triage Vital Signs ED Triage Vitals  Encounter Vitals Group     BP 09/25/23 0915 116/78     Systolic BP Percentile --      Diastolic BP Percentile --      Pulse Rate 09/25/23 0915 68     Resp 09/25/23 0915 20     Temp 09/25/23 0915 97.8 F (36.6 C)     Temp Source 09/25/23 0915 Oral     SpO2 09/25/23 0915 96 %     Weight --      Height --      Head Circumference --      Peak Flow --      Pain Score 09/25/23 0917 0     Pain Loc  --      Pain Education --      Exclude from Growth Chart --    No data found.  Updated Vital Signs BP 116/78 (BP Location: Right Arm)   Pulse 68   Temp 97.8 F (36.6 C) (Oral)   Resp 20   SpO2 96%   Visual Acuity Right Eye Distance:   Left Eye Distance:   Bilateral Distance:    Right Eye Near:   Left Eye Near:    Bilateral Near:     Physical Exam Constitutional:      General: He is not in acute distress.    Appearance: Normal appearance. He is not ill-appearing, toxic-appearing or diaphoretic.  HENT:     Right Ear: Tympanic membrane, ear canal and external ear normal.     Left Ear: Tympanic membrane, ear canal and external ear normal.     Nose: Congestion present.     Mouth/Throat:     Pharynx: Oropharynx is clear.  Eyes:     Conjunctiva/sclera: Conjunctivae normal.  Cardiovascular:     Rate and Rhythm: Normal rate and regular rhythm.     Pulses: Normal pulses.     Heart sounds: Normal heart sounds.  Pulmonary:     Effort: Pulmonary effort is normal.     Breath sounds: Normal breath sounds.  Musculoskeletal:        General: Normal range of motion.  Skin:    General: Skin is warm and dry.  Neurological:     Mental Status: He is alert.  Psychiatric:        Mood and Affect: Mood normal.      UC Treatments / Results  Labs (all labs ordered are listed, but only abnormal results are displayed) Labs Reviewed - No data to display  EKG   Radiology No results found.  Procedures Procedures (including critical care time)  Medications Ordered in UC Medications - No data to display  Initial Impression / Assessment and Plan / UC Course  I have reviewed the triage vital signs and the nursing notes.  Pertinent labs & imaging results that were available during my care of the patient were reviewed by me and considered in my medical decision making (see chart for details).     Acute sinusitis-patient with symptoms over 3 weeks and worsening.  Will go ahead  and treat with amoxicillin  at this time.  Recommend continue Mucinex  for congestion and symptoms as needed.  Follow-up for any continued issues Final Clinical Impressions(s) / UC Diagnoses   Final diagnoses:  Acute non-recurrent maxillary sinusitis  Discharge Instructions      Treating you for sinus infection.  Take the antibiotics as prescribed.  You can continue Mucinex  for congestion as needed.  Follow-up for any continued or worsening issues  ED Prescriptions     Medication Sig Dispense Auth. Provider   amoxicillin -clavulanate (AUGMENTIN ) 875-125 MG tablet Take 1 tablet by mouth every 12 (twelve) hours. 14 tablet Landa Pine, FNP      PDMP not reviewed this encounter.   Landa Pine, FNP 09/25/23 [phone]

## 2023-09-25 NOTE — ED Triage Notes (Signed)
 Nasal congestion x 3 weeks. Sore throat onset yesterday. Used Mucinex  last night and feeling slightly better today.

## 2023-09-25 NOTE — Discharge Instructions (Signed)
 Treating you for sinus infection.  Take the antibiotics as prescribed.  You can continue Mucinex  for congestion as needed.  Follow-up for any continued or worsening issues

## 2023-10-08 ENCOUNTER — Ambulatory Visit (INDEPENDENT_AMBULATORY_CARE_PROVIDER_SITE_OTHER): Admitting: Family Medicine

## 2023-10-08 VITALS — BP 124/78 | HR 64 | Temp 97.1°F | Ht 70.0 in | Wt 257.2 lb

## 2023-10-08 DIAGNOSIS — Z0000 Encounter for general adult medical examination without abnormal findings: Secondary | ICD-10-CM

## 2023-10-08 DIAGNOSIS — Z13220 Encounter for screening for lipoid disorders: Secondary | ICD-10-CM

## 2023-10-08 DIAGNOSIS — J4540 Moderate persistent asthma, uncomplicated: Secondary | ICD-10-CM

## 2023-10-08 DIAGNOSIS — E6609 Other obesity due to excess calories: Secondary | ICD-10-CM

## 2023-10-08 DIAGNOSIS — R0981 Nasal congestion: Secondary | ICD-10-CM

## 2023-10-08 DIAGNOSIS — Z23 Encounter for immunization: Secondary | ICD-10-CM | POA: Diagnosis not present

## 2023-10-08 DIAGNOSIS — Z131 Encounter for screening for diabetes mellitus: Secondary | ICD-10-CM | POA: Diagnosis not present

## 2023-10-08 DIAGNOSIS — Z6836 Body mass index (BMI) 36.0-36.9, adult: Secondary | ICD-10-CM | POA: Diagnosis not present

## 2023-10-08 DIAGNOSIS — E66812 Obesity, class 2: Secondary | ICD-10-CM

## 2023-10-08 MED ORDER — BUDESONIDE-FORMOTEROL FUMARATE 160-4.5 MCG/ACT IN AERO
2.0000 | INHALATION_SPRAY | Freq: Two times a day (BID) | RESPIRATORY_TRACT | 12 refills | Status: DC
  Filled 2023-10-18 (×2): qty 30.6, 90d supply, fill #0
  Filled 2024-01-16: qty 30.6, 90d supply, fill #1
  Filled 2024-01-25: qty 30.6, 90d supply, fill #0

## 2023-10-08 NOTE — Progress Notes (Signed)
 Established Patient Office Visit   Subjective:  Patient ID: Jason Hurst, male    DOB: 29-Jan-1981  Age: 43 y.o. MRN: [id number]  Chief Complaint  Patient presents with   Annual Exam    HPI Encounter Diagnoses  Name Primary?   Healthcare maintenance Yes   Immunization due    Screening for cholesterol level    Screening for diabetes mellitus    Nasal congestion    Class 2 obesity due to excess calories with body mass index (BMI) of 36.0 to 36.9 in adult, unspecified whether serious comorbidity present    Moderate persistent asthma without complication    For physical and follow-up of above.  Mostly doing well after his hospitalization.  He has been using his Symbicort  regularly.  He has been dealing with nasal congestion and decreased smell over the last month or so.  Recently started Flonase .  20 pound weight gain.  No regular exercise outside of work.  He does have regular dental care.   Review of Systems  Constitutional: Negative.   HENT:  Positive for congestion.   Eyes:  Negative for blurred vision, discharge and redness.  Respiratory: Negative.    Cardiovascular: Negative.   Gastrointestinal:  Negative for abdominal pain.  Genitourinary: Negative.   Musculoskeletal: Negative.  Negative for myalgias.  Skin:  Negative for rash.  Neurological:  Negative for tingling, loss of consciousness and weakness.  Endo/Heme/Allergies:  Negative for polydipsia.     Current Outpatient Medications:    albuterol  (VENTOLIN  HFA) 108 (90 Base) MCG/ACT inhaler, Inhale 2 puffs into the lungs every 6 (six) hours as needed for wheezing or shortness of breath., Disp: 8 g, Rfl: 0   fluticasone  (FLONASE ) 50 MCG/ACT nasal spray, Place 2 sprays into both nostrils daily. (Patient taking differently: Place 2 sprays into both nostrils as needed for allergies or rhinitis.), Disp: 16 g, Rfl: 6   amoxicillin -clavulanate (AUGMENTIN ) 875-125 MG tablet, Take 1 tablet by mouth every 12 (twelve) hours.  (Patient not taking: Reported on 10/08/2023), Disp: 14 tablet, Rfl: 0   budesonide -formoterol  (SYMBICORT ) 160-4.5 MCG/ACT inhaler, Inhale 2 puffs into the lungs in the morning and at bedtime., Disp: 1 each, Rfl: 12   Objective:     BP 124/78 (Cuff Size: Large)   Pulse 64   Temp (!) 97.1 F (36.2 C) (Temporal)   Ht 5\' 10"  (1.778 m)   Wt 257 lb 3.2 oz (116.7 kg)   SpO2 97%   BMI 36.90 kg/m  Wt Readings from Last 3 Encounters:  10/08/23 257 lb 3.2 oz (116.7 kg)  07/26/23 254 lb 3.2 oz (115.3 kg)  05/06/23 237 lb (107.5 kg)      Physical Exam Constitutional:      General: He is not in acute distress.    Appearance: Normal appearance. He is not ill-appearing, toxic-appearing or diaphoretic.  HENT:     Head: Normocephalic and atraumatic.     Right Ear: Tympanic membrane, ear canal and external ear normal.     Left Ear: Tympanic membrane, ear canal and external ear normal.     Mouth/Throat:     Mouth: Mucous membranes are moist.     Pharynx: Oropharynx is clear. No oropharyngeal exudate or posterior oropharyngeal erythema.  Eyes:     General: No scleral icterus.       Right eye: No discharge.        Left eye: No discharge.     Extraocular Movements: Extraocular movements intact.     Conjunctiva/sclera:  Conjunctivae normal.     Pupils: Pupils are equal, round, and reactive to light.  Cardiovascular:     Rate and Rhythm: Normal rate and regular rhythm.  Pulmonary:     Effort: Pulmonary effort is normal. No respiratory distress.     Breath sounds: Normal breath sounds.  Abdominal:     General: Bowel sounds are normal.     Tenderness: There is no abdominal tenderness. There is no guarding.     Hernia: There is no hernia in the left inguinal area or right inguinal area.  Genitourinary:    Penis: Circumcised. No hypospadias, erythema, tenderness, discharge, swelling or lesions.      Testes:        Right: Mass, tenderness or swelling not present. Right testis is descended.         Left: Mass, tenderness or swelling not present. Left testis is descended.     Epididymis:     Right: Not inflamed or enlarged.     Left: Not inflamed or enlarged.  Musculoskeletal:     Cervical back: No rigidity or tenderness.  Lymphadenopathy:     Lower Body: No right inguinal adenopathy. No left inguinal adenopathy.  Skin:    General: Skin is warm and dry.  Neurological:     Mental Status: He is alert and oriented to person, place, and time.  Psychiatric:        Mood and Affect: Mood normal.        Behavior: Behavior normal.      No results found for any visits on 10/08/23.    The 10-year ASCVD risk score (Arnett DK, et al., 2019) is: 0.9%    Assessment & Plan:   Healthcare maintenance -     CBC with Differential/Platelet; Future -     Urinalysis, Routine w reflex microscopic; Future  Immunization due -     Pneumococcal conjugate vaccine 20-valent  Screening for cholesterol level -     Comprehensive metabolic panel with GFR; Future -     Lipid panel; Future  Screening for diabetes mellitus -     Comprehensive metabolic panel with GFR; Future -     Hemoglobin A1c; Future  Nasal congestion  Class 2 obesity due to excess calories with body mass index (BMI) of 36.0 to 36.9 in adult, unspecified whether serious comorbidity present  Moderate persistent asthma without complication -     Budesonide -Formoterol  Fumarate; Inhale 2 puffs into the lungs in the morning and at bedtime.  Dispense: 1 each; Refill: 12    Return in about 6 months (around 04/08/2024), or if symptoms worsen or fail to improve.  Information was given on health maintenance and disease prevention.  Information was also given on obesity and exercising to lose weight.  Recommended 30 minutes of exercise such as walking with his wife on most days of the week.  Will use the Flonase  daily for at least a month, if this does not relieve his nasal congestion, will refer to ENT.  Tonna Frederic, MD

## 2023-10-11 ENCOUNTER — Other Ambulatory Visit

## 2023-10-13 ENCOUNTER — Other Ambulatory Visit

## 2023-10-18 ENCOUNTER — Other Ambulatory Visit: Payer: Self-pay

## 2023-10-18 ENCOUNTER — Telehealth

## 2023-10-18 ENCOUNTER — Other Ambulatory Visit (HOSPITAL_BASED_OUTPATIENT_CLINIC_OR_DEPARTMENT_OTHER): Payer: Self-pay

## 2023-10-18 MED ORDER — ALBUTEROL SULFATE HFA 108 (90 BASE) MCG/ACT IN AERS
2.0000 | INHALATION_SPRAY | Freq: Four times a day (QID) | RESPIRATORY_TRACT | 1 refills | Status: DC | PRN
  Filled 2023-10-18: qty 18, 30d supply, fill #0

## 2023-10-26 ENCOUNTER — Telehealth: Admitting: Physician Assistant

## 2023-10-26 DIAGNOSIS — A084 Viral intestinal infection, unspecified: Secondary | ICD-10-CM | POA: Diagnosis not present

## 2023-10-26 DIAGNOSIS — J441 Chronic obstructive pulmonary disease with (acute) exacerbation: Secondary | ICD-10-CM

## 2023-10-26 DIAGNOSIS — K219 Gastro-esophageal reflux disease without esophagitis: Secondary | ICD-10-CM | POA: Diagnosis not present

## 2023-10-26 MED ORDER — PREDNISONE 20 MG PO TABS: 40.0000 mg | ORAL_TABLET | Freq: Every day | ORAL | 0 refills | Status: DC

## 2023-10-26 MED ORDER — PANTOPRAZOLE SODIUM 40 MG PO TBEC
40.0000 mg | DELAYED_RELEASE_TABLET | Freq: Every day | ORAL | 3 refills | Status: AC
  Filled 2024-01-25: qty 30, 30d supply, fill #0

## 2023-10-26 NOTE — Patient Instructions (Signed)
  [name], thank you for joining [name], [name] for today's virtual visit.  While this provider is not your primary care provider (PCP), if your PCP is located in our provider database this encounter information will be shared with them immediately following your visit.   A [facility] MyChart account gives you access to today's visit and all your visits, tests, and labs performed at [facility]  click here if you don't have a [facility] MyChart account or go to [email or web address]  Consent: (Patient) Trimaine R Hastings provided verbal consent for this virtual visit at the beginning of the encounter.  Current Medications:  Current Outpatient Medications:    albuterol  (VENTOLIN  HFA) 108 (90 Base) MCG/ACT inhaler, Inhale 2 puffs into the lungs every 6 (six) hours as needed for wheezing or shortness of breath., Disp: 8 g, Rfl: 0   albuterol  (VENTOLIN  HFA) 108 (90 Base) MCG/ACT inhaler, Inhale 2 puffs into the lungs every 6 (six) hours as needed for wheeze or shortness of breath., Disp: 18 g, Rfl: 1   budesonide -formoterol  (SYMBICORT ) 160-4.5 MCG/ACT inhaler, Inhale 2 puffs into the lungs in the morning and at bedtime., Disp: 10.2 g, Rfl: 12   fluticasone  (FLONASE ) 50 MCG/ACT nasal spray, Place 2 sprays into both nostrils daily. (Patient taking differently: Place 2 sprays into both nostrils as needed for allergies or rhinitis.), Disp: 16 g, Rfl: 6   Medications ordered in this encounter:  No orders of the defined types were placed in this encounter.    *If you need refills on other medications prior to your next appointment, please contact your pharmacy*  Follow-Up: Call back or seek an in-person evaluation if the symptoms worsen or if the condition fails to improve as anticipated.  [facility] [phone]  Other Instructions Please hydrate and rest. Follow the dietary recommendations below. Start the Protonix  daily.  Ok to continue your  regular COPD medications, adding on the prednisone  for any continued chest tightness/wheezing. Avoid NSAIDs (Advil , Ibuprofen ) until stomach symptoms resolve. You may use cautiously in the future when needed. If you note any non-resolving, new, or worsening symptoms despite treatment, please seek an in-person evaluation ASAP.    If you have been instructed to have an in-person evaluation today at a local Urgent Care facility, please use the link below. It will take you to a list of all of our available [facility], including address, phone number and hours of operation. Please do not delay care.  [facility]  If you or a family member do not have a primary care provider, use the link below to schedule a visit and establish care. When you choose a [facility] primary care physician or advanced practice provider, you gain a long-term partner in health. Find a Primary Care Provider  Learn more about [facility] in-office and virtual care options: [facility] - Get Care Now

## 2023-10-26 NOTE — Progress Notes (Signed)
 Virtual Visit Consent   Jason Hurst, you are scheduled for a virtual visit with a [facility] provider today. Just as with appointments in the office, your consent must be obtained to participate. Your consent will be active for this visit and any virtual visit you may have with one of our providers in the next 365 days. If you have a MyChart account, a copy of this consent can be sent to you electronically.  As this is a virtual visit, video technology does not allow for your provider to perform a traditional examination. This may limit your provider's ability to fully assess your condition. If your provider identifies any concerns that need to be evaluated in person or the need to arrange testing (such as labs, EKG, etc.), we will make arrangements to do so. Although advances in technology are sophisticated, we cannot ensure that it will always work on either your end or our end. If the connection with a video visit is poor, the visit may have to be switched to a telephone visit. With either a video or telephone visit, we are not always able to ensure that we have a secure connection.  By engaging in this virtual visit, you consent to the provision of healthcare and authorize for your insurance to be billed (if applicable) for the services provided during this visit. Depending on your insurance coverage, you may receive a charge related to this service.  I need to obtain your verbal consent now. Are you willing to proceed with your visit today? Jason Hurst has provided verbal consent on 10/26/2023 for a virtual visit (video or telephone). Jason Hurst, [place]  Date: 10/26/2023 12:20 PM   Virtual Visit via Video Note   I, Jason Hurst, connected with  Jason Hurst  ([id number], May 02, 1981) on 10/26/23 at 12:15 PM EDT by a video-enabled telemedicine application and verified that I am speaking with the correct person using two identifiers.  Location: Patient: Virtual Visit  Location Patient: Home Provider: Virtual Visit Location Provider: Home Office   I discussed the limitations of evaluation and management by telemedicine and the availability of in person appointments. The patient expressed understanding and agreed to proceed.    History of Present Illness: Jason Hurst is a 43 y.o. who identifies as a male who was assigned male at birth, and is being seen today for some throat tightness and burning with associated chest tightness and wheezing over past few days. Endorses Sunday with nasal congestion, substantial drainage, cough, nausea and episodic vomiting after exposure through his son with similar symptoms. Notes nausea/vomiting has resolved but due to inflammation feeling harder to breathe. Has history of COPD so this always concerns him. Taking symbicort  and rescue inhaler with some relief. Is noting residual heartburn and reflux sensations. Denies change to bowel habits.   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   COVID-19 long hauler manifesting chronic dyspnea 06/17/2021   Encounter for removal of sutures 12/06/2020   History of COVID-19 09/06/2020  Cough 09/06/2020   Elevated glucose 07/24/2020   Screening and evaluation for vasectomy 07/24/2020   Lateral epicondylitis of right elbow 05/29/2019   Plantar fasciitis, bilateral 05/29/2019   Tobacco abuse disorder 04/28/2018   On Chantix  therapy 04/28/2018    Allergies: No Known  Allergies Medications:  Current Outpatient Medications:    pantoprazole  (PROTONIX ) 40 MG tablet, Take 1 tablet (40 mg total) by mouth daily., Disp: 30 tablet, Rfl: 3   predniSONE  (DELTASONE ) 20 MG tablet, Take 2 tablets (40 mg total) by mouth daily with breakfast., Disp: 10 tablet, Rfl: 0   albuterol  (VENTOLIN  HFA) 108 (90 Base) MCG/ACT inhaler, Inhale 2 puffs into the lungs every 6 (six) hours as needed for wheezing or shortness of breath., Disp: 8 g, Rfl: 0   albuterol  (VENTOLIN  HFA) 108 (90 Base) MCG/ACT inhaler, Inhale 2 puffs into the lungs every 6 (six) hours as needed for wheeze or shortness of breath., Disp: 18 g, Rfl: 1   budesonide -formoterol  (SYMBICORT ) 160-4.5 MCG/ACT inhaler, Inhale 2 puffs into the lungs in the morning and at bedtime., Disp: 10.2 g, Rfl: 12   fluticasone  (FLONASE ) 50 MCG/ACT nasal spray, Place 2 sprays into both nostrils daily. (Patient taking differently: Place 2 sprays into both nostrils as needed for allergies or rhinitis.), Disp: 16 g, Rfl: 6  Observations/Objective: Patient is well-developed, well-nourished in no acute distress.  Resting comfortably at home.  Head is normocephalic, atraumatic.  No labored breathing.  Speech is clear and coherent with logical content.  Patient is alert and oriented at baseline.   Assessment and Plan: 1. Viral gastroenteritis  2. Chronic obstructive pulmonary disease with acute exacerbation (HCC) (Primary) - predniSONE  (DELTASONE ) 20 MG tablet; Take 2 tablets (40 mg total) by mouth daily with breakfast.  Dispense: 10 tablet; Refill: 0  3. Gastroesophageal reflux disease without esophagitis - pantoprazole  (PROTONIX ) 40 MG tablet; Take 1 tablet (40 mg total) by mouth daily.  Dispense: 30 tablet; Refill: 3  Initially with viral illness leading to both mild COPD exacerbation as well as increased reflux with mild gastritis. No continued symptoms of infection itself, but with residual effects including mild gastritis and  lingering reflux, as well as mild COPD exacerbation. Supportive measures and OTC medications reviewed. Continue routine COPD medications. Will add on course of Protonix  as well as GERD diet. If continued tightness or further work of breathing despite treating the GERD/gastritis, he is to start the prednisone  burst, taking as directed. Strict in-person evaluation precautions reviewed.  Follow Up Instructions: I discussed the assessment and treatment plan with the patient. The patient was provided an opportunity to ask questions and all were answered. The patient agreed with the plan and demonstrated an understanding of the instructions.  A copy of instructions were sent to the patient via MyChart unless otherwise noted below.   The patient was advised to call back or seek an in-person evaluation if the symptoms worsen or if the condition fails to improve as anticipated.    Jason Velma Lunger, [name]

## 2024-01-16 ENCOUNTER — Other Ambulatory Visit: Payer: Self-pay | Admitting: Physician Assistant

## 2024-01-18 ENCOUNTER — Other Ambulatory Visit: Payer: Self-pay

## 2024-01-18 ENCOUNTER — Other Ambulatory Visit (HOSPITAL_COMMUNITY): Payer: Self-pay

## 2024-01-19 ENCOUNTER — Encounter: Payer: Self-pay | Admitting: Pharmacist

## 2024-01-19 ENCOUNTER — Other Ambulatory Visit: Payer: Self-pay

## 2024-01-19 ENCOUNTER — Other Ambulatory Visit (HOSPITAL_COMMUNITY): Payer: Self-pay

## 2024-01-21 ENCOUNTER — Other Ambulatory Visit (HOSPITAL_COMMUNITY): Payer: Self-pay

## 2024-01-24 ENCOUNTER — Other Ambulatory Visit: Payer: Self-pay

## 2024-01-25 ENCOUNTER — Other Ambulatory Visit (HOSPITAL_BASED_OUTPATIENT_CLINIC_OR_DEPARTMENT_OTHER): Payer: Self-pay

## 2024-01-25 ENCOUNTER — Other Ambulatory Visit (HOSPITAL_COMMUNITY): Payer: Self-pay

## 2024-01-25 ENCOUNTER — Telehealth: Admitting: Physician Assistant

## 2024-01-25 ENCOUNTER — Other Ambulatory Visit: Payer: Self-pay

## 2024-01-25 DIAGNOSIS — B9689 Other specified bacterial agents as the cause of diseases classified elsewhere: Secondary | ICD-10-CM

## 2024-01-25 DIAGNOSIS — J019 Acute sinusitis, unspecified: Secondary | ICD-10-CM

## 2024-01-25 MED ORDER — DOXYCYCLINE HYCLATE 100 MG PO TABS
100.0000 mg | ORAL_TABLET | Freq: Two times a day (BID) | ORAL | 0 refills | Status: DC
  Filled 2024-01-25: qty 20, 10d supply, fill #0

## 2024-01-25 MED ORDER — FLUTICASONE PROPIONATE 50 MCG/ACT NA SUSP
2.0000 | NASAL | 0 refills | Status: AC | PRN
  Filled 2024-01-25: qty 16, 30d supply, fill #0

## 2024-01-25 NOTE — Patient Instructions (Signed)
 Jason Hurst, thank you for joining Jason Velma Lunger, [name] for today's virtual visit.  While this provider is not your primary care provider (PCP), if your PCP is located in our provider database this encounter information will be shared with them immediately following your visit.   A [facility] MyChart account gives you access to today's visit and all your visits, tests, and labs performed at [facility]  click here if you don't have a Jason Hurst MyChart account or go to [email or web address]  Consent: (Patient) Jason Hurst provided verbal consent for this virtual visit at the beginning of the encounter.  Current Medications:  Current Outpatient Medications:    albuterol  (VENTOLIN  HFA) 108 (90 Base) MCG/ACT inhaler, Inhale 2 puffs into the lungs every 6 (six) hours as needed for wheezing or shortness of breath., Disp: 8 g, Rfl: 0   albuterol  (VENTOLIN  HFA) 108 (90 Base) MCG/ACT inhaler, Inhale 2 puffs into the lungs every 6 (six) hours as needed for wheeze or shortness of breath., Disp: 18 g, Rfl: 1   budesonide -formoterol  (SYMBICORT ) 160-4.5 MCG/ACT inhaler, Inhale 2 puffs into the lungs in the morning and at bedtime., Disp: 10.2 g, Rfl: 12   fluticasone  (FLONASE ) 50 MCG/ACT nasal spray, Place 2 sprays into both nostrils daily. (Patient taking differently: Place 2 sprays into both nostrils as needed for allergies or rhinitis.), Disp: 16 g, Rfl: 6   pantoprazole  (PROTONIX ) 40 MG tablet, Take 1 tablet (40 mg total) by mouth daily., Disp: 30 tablet, Rfl: 3   predniSONE  (DELTASONE ) 20 MG tablet, Take 2 tablets (40 mg total) by mouth daily with breakfast., Disp: 10 tablet, Rfl: 0   Medications ordered in this encounter:  No orders of the defined types were placed in this encounter.    *If you need refills on other medications prior to your next appointment, please contact your pharmacy*  Follow-Up: Call back or seek an in-person evaluation if the symptoms worsen  or if the condition fails to improve as anticipated.  [facility] [phone]  Other Instructions Please take antibiotic as directed.  Increase fluid intake.  Use Saline nasal spray.  Take a daily multivitamin. Restart Flonase .  Place a humidifier in the bedroom.  Please call or return clinic if symptoms are not improving.  Sinusitis Sinusitis is redness, soreness, and swelling (inflammation) of the paranasal sinuses. Paranasal sinuses are air pockets within the bones of your face (beneath the eyes, the middle of the forehead, or above the eyes). In healthy paranasal sinuses, mucus is able to drain out, and air is able to circulate through them by way of your nose. However, when your paranasal sinuses are inflamed, mucus and air can become trapped. This can allow bacteria and other germs to grow and cause infection. Sinusitis can develop quickly and last only a short time (acute) or continue over a long period (chronic). Sinusitis that lasts for more than 12 weeks is considered chronic.  CAUSES  Causes of sinusitis include: Allergies. Structural abnormalities, such as displacement of the cartilage that separates your nostrils (deviated septum), which can decrease the air flow through your nose and sinuses and affect sinus drainage. Functional abnormalities, such as when the small hairs (cilia) that line your sinuses and help remove mucus do not work properly or are not present. SYMPTOMS  Symptoms of acute and chronic sinusitis are the same. The primary symptoms are pain and pressure around the affected sinuses. Other symptoms include: Upper toothache. Earache. Headache. Bad breath. Decreased  sense of smell and taste. A cough, which worsens when you are lying flat. Fatigue. Fever. Thick drainage from your nose, which often is green and may contain pus (purulent). Swelling and warmth over the affected sinuses. DIAGNOSIS  Your caregiver will perform a physical exam. During  the exam, your caregiver may: Look in your nose for signs of abnormal growths in your nostrils (nasal polyps). Tap over the affected sinus to check for signs of infection. View the inside of your sinuses (endoscopy) with a special imaging device with a light attached (endoscope), which is inserted into your sinuses. If your caregiver suspects that you have chronic sinusitis, one or more of the following tests may be recommended: Allergy tests. Nasal culture A sample of mucus is taken from your nose and sent to a lab and screened for bacteria. Nasal cytology A sample of mucus is taken from your nose and examined by your caregiver to determine if your sinusitis is related to an allergy. TREATMENT  Most cases of acute sinusitis are related to a viral infection and will resolve on their own within 10 days. Sometimes medicines are prescribed to help relieve symptoms (pain medicine, decongestants, nasal steroid sprays, or saline sprays).  However, for sinusitis related to a bacterial infection, your caregiver will prescribe antibiotic medicines. These are medicines that will help kill the bacteria causing the infection.  Rarely, sinusitis is caused by a fungal infection. In theses cases, your caregiver will prescribe antifungal medicine. For some cases of chronic sinusitis, surgery is needed. Generally, these are cases in which sinusitis recurs more than 3 times per year, despite other treatments. HOME CARE INSTRUCTIONS  Drink plenty of water. Water helps thin the mucus so your sinuses can drain more easily. Use a humidifier. Inhale steam 3 to 4 times a day (for example, sit in the bathroom with the shower running). Apply a warm, moist washcloth to your face 3 to 4 times a day, or as directed by your caregiver. Use saline nasal sprays to help moisten and clean your sinuses. Take over-the-counter or prescription medicines for pain, discomfort, or fever only as directed by your caregiver. SEEK IMMEDIATE  MEDICAL CARE IF: You have increasing pain or severe headaches. You have nausea, vomiting, or drowsiness. You have swelling around your face. You have vision problems. You have a stiff neck. You have difficulty breathing. MAKE SURE YOU:  Understand these instructions. Will watch your condition. Will get help right away if you are not doing well or get worse. Document Released: 04/20/2005 Document Revised: 07/13/2011 Document Reviewed: 05/05/2011 [facility] Patient Information 2014 [place], [place].    If you have been instructed to have an in-person evaluation today at a local Urgent Care facility, please use the link below. It will take you to a list of all of our available [facility], including address, phone number and hours of operation. Please do not delay care.  [facility]  If you or a family member do not have a primary care provider, use the link below to schedule a visit and establish care. When you choose a [facility] primary care physician or advanced practice provider, you gain a long-term partner in health. Find a Primary Care Provider  Learn more about [facility]'s in-office and virtual care options: [facility] - Get Care Now

## 2024-01-25 NOTE — Progress Notes (Signed)
 Virtual Visit Consent   Jason Hurst, you are scheduled for a virtual visit with a [facility] provider today. Just as with appointments in the office, your consent must be obtained to participate. Your consent will be active for this visit and any virtual visit you may have with one of our providers in the next 365 days. If you have a MyChart account, a copy of this consent can be sent to you electronically.  As this is a virtual visit, video technology does not allow for your provider to perform a traditional examination. This may limit your provider's ability to fully assess your condition. If your provider identifies any concerns that need to be evaluated in person or the need to arrange testing (such as labs, EKG, etc.), we will make arrangements to do so. Although advances in technology are sophisticated, we cannot ensure that it will always work on either your end or our end. If the connection with a video visit is poor, the visit may have to be switched to a telephone visit. With either a video or telephone visit, we are not always able to ensure that we have a secure connection.  By engaging in this virtual visit, you consent to the provision of healthcare and authorize for your insurance to be billed (if applicable) for the services provided during this visit. Depending on your insurance coverage, you may receive a charge related to this service.  I need to obtain your verbal consent now. Are you willing to proceed with your visit today? Jason Hurst has provided verbal consent on 01/25/2024 for a virtual visit (video or telephone). Jason Hurst, [place]  Date: 01/25/2024 9:51 AM   Virtual Visit via Video Note   I, Jason Hurst, connected with  Jason Hurst  ([id number], 21-Nov-1980) on 01/25/24 at  9:45 AM EDT by a video-enabled telemedicine application and verified that I am speaking with the correct person using two identifiers.  Location: Patient: Virtual Visit  Location Patient: Home Provider: Virtual Visit Location Provider: Home Office   I discussed the limitations of evaluation and management by telemedicine and the availability of in person appointments. The patient expressed understanding and agreed to proceed.    History of Present Illness: Jason Hurst is a 43 y.o. who identifies as a male who was assigned male at birth, and is being seen today for 2 weeks of URI symptoms starting with nasal congestion, sinus pressure, decreased sense of smell. Denies fever, chills, aches at any point since onset of symptoms. Now with sinus pain and change in nasal discharge -- now very thick and yellow. Blood-tinged sputum.  Notes nasal congestion makes it feel like harder to breathe.   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   COVID-19 long hauler manifesting chronic dyspnea 06/17/2021   Encounter for removal of sutures 12/06/2020   History of COVID-19 09/06/2020   Cough 09/06/2020   Elevated glucose 07/24/2020   Screening and evaluation for vasectomy 07/24/2020  Lateral epicondylitis of right elbow 05/29/2019   Plantar fasciitis, bilateral 05/29/2019   Tobacco abuse disorder 04/28/2018   On Chantix  therapy 04/28/2018    Allergies: No Known Allergies Medications:  Current Outpatient Medications:    albuterol  (VENTOLIN  HFA) 108 (90 Base) MCG/ACT inhaler, Inhale 2 puffs into the lungs every 6 (six) hours as needed  for wheezing or shortness of breath., Disp: 8 g, Rfl: 0   albuterol  (VENTOLIN  HFA) 108 (90 Base) MCG/ACT inhaler, Inhale 2 puffs into the lungs every 6 (six) hours as needed for wheeze or shortness of breath., Disp: 18 g, Rfl: 1   budesonide -formoterol  (SYMBICORT ) 160-4.5 MCG/ACT inhaler, Inhale 2 puffs into the lungs in the morning and at bedtime., Disp: 10.2 g, Rfl: 12   doxycycline  (VIBRA -TABS) 100 MG tablet, Take 1 tablet (100 mg total) by mouth 2 (two) times daily., Disp: 20 tablet, Rfl: 0   fluticasone  (FLONASE ) 50 MCG/ACT nasal spray, Place 2 sprays into both nostrils as needed for allergies or rhinitis., Disp: 16 g, Rfl: 0   pantoprazole  (PROTONIX ) 40 MG tablet, Take 1 tablet (40 mg total) by mouth daily., Disp: 30 tablet, Rfl: 3  Observations/Objective: Patient is well-developed, well-nourished in no acute distress.  Resting comfortably  at home.  Head is normocephalic, atraumatic.  No labored breathing.  Speech is clear and coherent with logical content.  Patient is alert and oriented at baseline.   Assessment and Plan: 1. Acute bacterial sinusitis (Primary) - fluticasone  (FLONASE ) 50 MCG/ACT nasal spray; Place 2 sprays into both nostrils as needed for allergies or rhinitis.  Dispense: 16 g; Refill: 0 - doxycycline  (VIBRA -TABS) 100 MG tablet; Take 1 tablet (100 mg total) by mouth 2 (two) times daily.  Dispense: 20 tablet; Refill: 0  Rx Doxycycline .  Increase fluids.  Rest.  Saline nasal spray.  Probiotic.  Mucinex  as directed.  Humidifier in bedroom. Flonase  per orders.  Call or return to clinic if symptoms are not improving.   Follow Up Instructions: I discussed the assessment and treatment plan with the patient. The patient was provided an opportunity to ask questions and all were answered. The patient agreed with the plan and demonstrated an understanding of the instructions.  A copy of instructions were sent to the patient via MyChart unless otherwise noted below.   The  patient was advised to call back or seek an in-person evaluation if the symptoms worsen or if the condition fails to improve as anticipated.    Jason Velma Lunger, [name]

## 2024-01-26 ENCOUNTER — Other Ambulatory Visit (HOSPITAL_COMMUNITY): Payer: Self-pay

## 2024-01-26 ENCOUNTER — Other Ambulatory Visit: Payer: Self-pay | Admitting: Physician Assistant

## 2024-02-01 ENCOUNTER — Other Ambulatory Visit (HOSPITAL_COMMUNITY): Payer: Self-pay

## 2024-02-03 ENCOUNTER — Other Ambulatory Visit: Payer: Self-pay | Admitting: Physician Assistant

## 2024-02-03 ENCOUNTER — Other Ambulatory Visit (HOSPITAL_COMMUNITY): Payer: Self-pay

## 2024-02-08 ENCOUNTER — Other Ambulatory Visit (HOSPITAL_COMMUNITY): Payer: Self-pay

## 2024-02-14 ENCOUNTER — Other Ambulatory Visit (HOSPITAL_COMMUNITY): Payer: Self-pay

## 2024-02-22 ENCOUNTER — Telehealth: Admitting: Physician Assistant

## 2024-02-22 DIAGNOSIS — J4541 Moderate persistent asthma with (acute) exacerbation: Secondary | ICD-10-CM | POA: Diagnosis not present

## 2024-02-22 MED ORDER — PREDNISONE 20 MG PO TABS: 40.0000 mg | ORAL_TABLET | Freq: Every day | ORAL | 0 refills | Status: AC

## 2024-02-22 NOTE — Progress Notes (Signed)
 Virtual Visit Consent   Jason Hurst, you are scheduled for a virtual visit with a [facility] provider today. Just as with appointments in the office, your consent must be obtained to participate. Your consent will be active for this visit and any virtual visit you may have with one of our providers in the next 365 days. If you have a MyChart account, a copy of this consent can be sent to you electronically.  As this is a virtual visit, video technology does not allow for your provider to perform a traditional examination. This may limit your provider's ability to fully assess your condition. If your provider identifies any concerns that need to be evaluated in person or the need to arrange testing (such as labs, EKG, etc.), we will make arrangements to do so. Although advances in technology are sophisticated, we cannot ensure that it will always work on either your end or our end. If the connection with a video visit is poor, the visit may have to be switched to a telephone visit. With either a video or telephone visit, we are not always able to ensure that we have a secure connection.  By engaging in this virtual visit, you consent to the provision of healthcare and authorize for your insurance to be billed (if applicable) for the services provided during this visit. Depending on your insurance coverage, you may receive a charge related to this service.  I need to obtain your verbal consent now. Are you willing to proceed with your visit today? Mansel R Dyal has provided verbal consent on 02/22/2024 for a virtual visit (video or telephone). Jason Hurst, [place]  Date: 02/22/2024 6:27 PM   Virtual Visit via Video Note   I, Jason Hurst, connected with  THEOPOLIS SLOOP  ([id number], 11-20-1980) on 02/22/24 at  6:30 PM EDT by a video-enabled telemedicine application and verified that I am speaking with the correct person using two identifiers.  Location: Patient: Virtual Visit  Location Patient: Home Provider: Virtual Visit Location Provider: Home Office   I discussed the limitations of evaluation and management by telemedicine and the availability of in person appointments. The patient expressed understanding and agreed to proceed.    History of Present Illness: Jason Hurst is a 43 y.o. who identifies as a male who was assigned male at birth, and is being seen today for concern of a possible asthma/COPD exacerbation. Endorses some increased work of breathing over the past week with chest tightness and wheezing. Feels ok overall at rest, but more noticeable when active. Notes increased use of his inhaler. Denies fever, chills. Denies increased sputum production.   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   COVID-19 long hauler manifesting chronic dyspnea 06/17/2021   Encounter for removal of sutures 12/06/2020   History of COVID-19 09/06/2020   Cough 09/06/2020   Elevated glucose 07/24/2020   Screening and evaluation for vasectomy 07/24/2020   Lateral epicondylitis of right elbow 05/29/2019   Plantar fasciitis, bilateral 05/29/2019  Tobacco abuse disorder 04/28/2018   On Chantix  therapy 04/28/2018    Allergies: No Known Allergies Medications:  Current Outpatient Medications:    predniSONE  (DELTASONE ) 20 MG tablet, Take 2 tablets (40 mg total) by mouth daily with breakfast., Disp: 10 tablet, Rfl: 0   albuterol  (VENTOLIN  HFA) 108 (90 Base) MCG/ACT  inhaler, Inhale 2 puffs into the lungs every 6 (six) hours as needed for wheezing or shortness of breath., Disp: 8 g, Rfl: 0   albuterol  (VENTOLIN  HFA) 108 (90 Base) MCG/ACT inhaler, Inhale 2 puffs into the lungs every 6 (six) hours as needed for wheeze or shortness of breath., Disp: 18 g, Rfl: 1   budesonide -formoterol  (SYMBICORT ) 160-4.5 MCG/ACT inhaler, Inhale 2 puffs into the lungs in the morning and at bedtime., Disp: 10.2 g, Rfl: 12   fluticasone  (FLONASE ) 50 MCG/ACT nasal spray, Place 2 sprays into both nostrils as needed for allergies or rhinitis., Disp: 16 g, Rfl: 0   pantoprazole  (PROTONIX ) 40 MG tablet, Take 1 tablet (40 mg total) by mouth daily., Disp: 30 tablet, Rfl: 3  Observations/Objective: Patient is well-developed, well-nourished in no acute distress.  Resting comfortably at home.  Head is normocephalic, atraumatic.  No labored breathing.  Speech is clear and coherent with logical content.  Patient is alert and oriented at baseline.   Assessment and Plan: 1. Moderate persistent asthma with exacerbation (Primary) - predniSONE  (DELTASONE ) 20 MG tablet; Take 2 tablets (40 mg total) by mouth daily with breakfast.  Dispense: 10 tablet; Refill: 0  No alarm signs or symptoms. Will have him continued chronic asthma medications. Will add on burst of prednisone . Supportive measures reviewed. In-person follow-up precaution discussed.  Follow Up Instructions: I discussed the assessment and treatment plan with the patient. The patient was provided an opportunity to ask questions and all were answered. The patient agreed with the plan and demonstrated an understanding of the instructions.  A copy of instructions were sent to the patient via MyChart unless otherwise noted below.    The patient was advised to call back or seek an in-person evaluation if the symptoms worsen or if the condition fails to improve as anticipated.    Jason Velma Lunger, [name]

## 2024-02-22 NOTE — Patient Instructions (Signed)
 Jason Hurst, thank you for joining Jason Velma Lunger, [name] for today's virtual visit.  While this provider is not your primary care provider (PCP), if your PCP is located in our provider database this encounter information will be shared with them immediately following your visit.   A [facility] MyChart account gives you access to today's visit and all your visits, tests, and labs performed at [facility]  click here if you don't have a Portal MyChart account or go to [email or web address]  Consent: (Patient) Jason Hurst provided verbal consent for this virtual visit at the beginning of the encounter.  Current Medications:  Current Outpatient Medications:    albuterol  (VENTOLIN  HFA) 108 (90 Base) MCG/ACT inhaler, Inhale 2 puffs into the lungs every 6 (six) hours as needed for wheezing or shortness of breath., Disp: 8 g, Rfl: 0   albuterol  (VENTOLIN  HFA) 108 (90 Base) MCG/ACT inhaler, Inhale 2 puffs into the lungs every 6 (six) hours as needed for wheeze or shortness of breath., Disp: 18 g, Rfl: 1   budesonide -formoterol  (SYMBICORT ) 160-4.5 MCG/ACT inhaler, Inhale 2 puffs into the lungs in the morning and at bedtime., Disp: 10.2 g, Rfl: 12   doxycycline  (VIBRA -TABS) 100 MG tablet, Take 1 tablet (100 mg total) by mouth 2 (two) times daily., Disp: 20 tablet, Rfl: 0   fluticasone  (FLONASE ) 50 MCG/ACT nasal spray, Place 2 sprays into both nostrils as needed for allergies or rhinitis., Disp: 16 g, Rfl: 0   pantoprazole  (PROTONIX ) 40 MG tablet, Take 1 tablet (40 mg total) by mouth daily., Disp: 30 tablet, Rfl: 3   Medications ordered in this encounter:  No orders of the defined types were placed in this encounter.    *If you need refills on other medications prior to your next appointment, please contact your pharmacy*  Follow-Up: Call back or seek an in-person evaluation if the symptoms worsen or if the condition fails to improve as anticipated.  [facility] [phone]  Other Instructions Please hydrate and rest. Continue your routine medication and rescue inhaler. Take the prednisone  as directed. Keep check on your oxygen level at home as discussed. If < 90% and not improving with medication, you need an in-person evaluation ASAP.   Asthma Triggers and Exacerbation In this video, you will learn about exposures and other factors that can lead to asthma attacks and trouble controlling asthma. To view the content, go to this web address: [email or web address]  This video will expire on: 04/14/2025. If you need access to this video following this date, please reach out to the healthcare provider who assigned it to you. This information is not intended to replace advice given to you by your health care provider. Make sure you discuss any questions you have with your health care provider. Elsevier Patient Education  2024 [employer] Inc[employer]   If you have been instructed to have an in-person evaluation today at a local Urgent Care facility, please use the link below. It will take you to a list of all of our available [facility], including address, phone number and hours of operation. Please do not delay care.  [facility]  If you or a family member do not have a primary care provider, use the link below to schedule a visit and establish care. When you choose a  primary care physician or advanced practice provider, you gain a long-term partner in health. Find a Primary Care Provider  Learn more about [facility]'s in-office and virtual care options: [facility] - Get Care Now

## 2024-02-28 ENCOUNTER — Other Ambulatory Visit (HOSPITAL_COMMUNITY): Payer: Self-pay

## 2024-02-28 ENCOUNTER — Encounter: Payer: Self-pay | Admitting: Family Medicine

## 2024-02-29 ENCOUNTER — Other Ambulatory Visit: Payer: Self-pay | Admitting: Physician Assistant

## 2024-02-29 ENCOUNTER — Other Ambulatory Visit: Payer: Self-pay

## 2024-02-29 ENCOUNTER — Other Ambulatory Visit: Payer: Self-pay | Admitting: Nurse Practitioner

## 2024-03-03 ENCOUNTER — Encounter: Payer: Self-pay | Admitting: Nurse Practitioner

## 2024-03-03 MED ORDER — ALBUTEROL SULFATE HFA 108 (90 BASE) MCG/ACT IN AERS: 2.0000 | INHALATION_SPRAY | Freq: Four times a day (QID) | RESPIRATORY_TRACT | 5 refills | Status: AC | PRN

## 2024-03-24 ENCOUNTER — Other Ambulatory Visit (HOSPITAL_BASED_OUTPATIENT_CLINIC_OR_DEPARTMENT_OTHER): Payer: Self-pay

## 2024-03-24 ENCOUNTER — Ambulatory Visit (HOSPITAL_BASED_OUTPATIENT_CLINIC_OR_DEPARTMENT_OTHER): Admitting: Pulmonary Disease

## 2024-03-24 ENCOUNTER — Encounter (HOSPITAL_BASED_OUTPATIENT_CLINIC_OR_DEPARTMENT_OTHER): Payer: Self-pay | Admitting: Pulmonary Disease

## 2024-03-24 ENCOUNTER — Encounter (HOSPITAL_BASED_OUTPATIENT_CLINIC_OR_DEPARTMENT_OTHER): Payer: Self-pay | Admitting: Pharmacy Technician

## 2024-03-24 VITALS — BP 133/99 | HR 75 | Temp 98.0°F | Ht 70.0 in | Wt 253.8 lb

## 2024-03-24 DIAGNOSIS — J329 Chronic sinusitis, unspecified: Secondary | ICD-10-CM | POA: Diagnosis not present

## 2024-03-24 DIAGNOSIS — J4489 Other specified chronic obstructive pulmonary disease: Secondary | ICD-10-CM | POA: Diagnosis not present

## 2024-03-24 MED ORDER — FLUTICASONE-SALMETEROL 230-21 MCG/ACT IN AERO
2.0000 | INHALATION_SPRAY | Freq: Two times a day (BID) | RESPIRATORY_TRACT | 5 refills | Status: AC
  Filled 2024-03-24 – 2024-05-03 (×2): qty 12, 30d supply, fill #0

## 2024-03-24 MED ORDER — AMOXICILLIN-POT CLAVULANATE 875-125 MG PO TABS
1.0000 | ORAL_TABLET | Freq: Two times a day (BID) | ORAL | 0 refills | Status: AC
  Filled 2024-03-24 – 2024-05-03 (×2): qty 14, 7d supply, fill #0

## 2024-03-24 MED ORDER — IPRATROPIUM BROMIDE 0.03 % NA SOLN
2.0000 | Freq: Two times a day (BID) | NASAL | 2 refills | Status: AC
  Filled 2024-03-24: qty 30, 43d supply, fill #0
  Filled 2024-05-03: qty 30, 75d supply, fill #0

## 2024-03-24 NOTE — Patient Instructions (Signed)
 Recurrent sinusitis --REFER to ENT --START atrovent  1-2 sprays per nostril twice a day as needed --Continue flonase  1-2 sprays once a day as needed --Neti pot every 2-3 days for nasal cleansing --AVOID Afrin  COPD with asthma (HCC) Uncontrolled with frequent exacerbations preceded by sinusitis --STOP Symbicort  --START brand name Advair  HFA 230-21 mcg TWO puffs in the morning and evening. Rinse mouth out after use --CONTINUE Albuterol  AS NEEDED for shortness of breath or wheezing --Discussed vaccines. Recommend influenza. Patient will considering it.  Asthma Action Plan Use Albuterol  for worsening shortness of breath, wheezing and cough. If you symptoms do not improve in 24-48 hours, please our office for evaluation and/or prednisone  taper.

## 2024-03-24 NOTE — Assessment & Plan Note (Signed)
 Uncontrolled with frequent exacerbations preceded by sinusitis --STOP Symbicort  --START brand name Advair  HFA 230-21 mcg TWO puffs in the morning and evening. Rinse mouth out after use --CONTINUE Albuterol  AS NEEDED for shortness of breath or wheezing --Discussed vaccines. Recommend influenza. Patient will considering it.  Asthma Action Plan Use Albuterol  for worsening shortness of breath, wheezing and cough. If you symptoms do not improve in 24-48 hours, please our office for evaluation and/or prednisone  taper.

## 2024-03-24 NOTE — Progress Notes (Signed)
 Subjective:   PATIENT ID: Jason Hurst GENDER: male DOB: 02/01/1981, MRN: [id number]   HPI  Chief Complaint  Patient presents with   Asthma    Reason for Visit: Follow-up  [name] is a 43 year old male former smoker with hx COVID in 2021 who presents for COPD-asthma follow-up.  Initial consult He was recently seen for asthma flareup by his primary physician Dr. Berneta in January 2023.  Note from 05/30/2021 and 06/06/2021 were reviewed.  Had chest tightness and wheezing for few weeks requiring frequent albuterol  use.  He was started on Advair  250/50 1 puff twice a day and prednisone  pack.  Flareup was resolved.  He reports he was diagnosed with COVID in 2021. He was ill for 2 weeks with fever. Since then he began having more frequent respiratory symptoms every month. 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 He has cough with some phlegm, wheezing and associated shortness of breath and chest congestion. Worsens with exertion including uphill and upstairs. Not usually associated with chest pain but can occur once a month. Activity is limited when ill but ok when he is feeling well. Does not exercise regularly but does yardwork daily. Denies childhood asthma or respiratory infections.  09/22/21 Since our last visit he has been on Advair  with improvement in shortness of breath. Cough is occasional. No wheezing. Able to work without issue. 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   01/08/23 Since our last visit he was treated for sinus infection. He reports he is breathing well now but had the infection for a month. Uses flonase  in the evening with help for nasal congestion. Denies cough, shortness of breath and wheezing. Has been out of advair  for 2-3 days. Worried that his inhaler is contributing to his sinus infections.   03/24/24 Since our last visit he has had two exacerbations. Last UC exacerbation was for sinusitis in 09/2023 which was treated with augmentin , 10/26/23 CODP  exacerbation with prednisone  taper and 01/25/24 sinusitis treated with doxycycline  and 02/22/24 video visit for asthma exacerbation with prednisone  taper. He reports nasal congestion with productive cough. Has not been able to smell for 4 months due to congestion. Compliant with Symbicort  with breakthrough symptoms. Uses albuterol  with intense activity at work twice a day. Wakes up once a night due to congestion with productive cough.. Denies wheezing.   Asthma Control Test ACT Total Score  03/24/2024  8:41 AM 11  07/26/2023  8:28 AM 25  06/17/2021 10:51 AM 13    Social History: Started smoking at 18 years. Previously 2ppd x 20 years. Quit at 43 years old, no longer smoker. 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 Smokes daily 4-5g   Past Medical History:  Diagnosis Date   COPD (chronic obstructive pulmonary disease) (HCC)    COVID-19 long hauler      Family History  Problem Relation Age of Onset   Healthy Mother    Diabetes Father    Heart disease Father    Healthy Sister    Healthy Brother    Heart attack Maternal Uncle      Social History   Occupational History   Not on file  Tobacco Use   Smoking status: Former    Current packs/day: 0.00    Average packs/day: 1.5 packs/day for 22.0 years (33.0 ttl pk-yrs)    Types: Cigarettes    Start date: 05/05/1996    Quit date: 05/05/2018    Years  since quitting: 5.8    Passive exposure: Past   Smokeless tobacco: Never  Vaping Use   Vaping status: Never Used  Substance and Sexual Activity   Alcohol use: Yes    Comment: very occasionally.    Drug use: Yes    Types: Marijuana   Sexual activity: Yes    No Known Allergies   Outpatient Medications Prior to Visit  Medication Sig Dispense Refill   albuterol  (VENTOLIN  HFA) 108 (90 Base) MCG/ACT inhaler Inhale 2 puffs into the lungs every 6 (six) hours as needed for wheeze or shortness of breath. 18 g 5   fluticasone  (FLONASE ) 50 MCG/ACT nasal spray Place 2 sprays  into both nostrils as needed for allergies or rhinitis. 16 g 0   pantoprazole  (PROTONIX ) 40 MG tablet Take 1 tablet (40 mg total) by mouth daily. 30 tablet 3   budesonide -formoterol  (SYMBICORT ) 160-4.5 MCG/ACT inhaler Inhale 2 puffs into the lungs in the morning and at bedtime. 10.2 g 12   albuterol  (VENTOLIN  HFA) 108 (90 Base) MCG/ACT inhaler Inhale 2 puffs into the lungs every 6 (six) hours as needed for wheezing or shortness of breath. (Patient not taking: Reported on 03/24/2024) 8 g 0   predniSONE  (DELTASONE ) 20 MG tablet Take 2 tablets (40 mg total) by mouth daily with breakfast. (Patient not taking: Reported on 03/24/2024) 10 tablet 0   No facility-administered medications prior to visit.    Review of Systems  Constitutional:  Negative for chills, diaphoresis, fever, malaise/fatigue and weight loss.  HENT:  Positive for congestion.   Respiratory:  Negative for cough, hemoptysis, sputum production, shortness of breath and wheezing.   Cardiovascular:  Negative for chest pain, palpitations and leg swelling.     Objective:   Vitals:   03/24/24 0838  BP: (!) 133/99  Pulse: 75  Temp: 98 F (36.7 C)  SpO2: 97%  Weight: 253 lb 12.8 oz (115.1 kg)  Height: 5' 10 (1.778 m)   SpO2: 97 %  Physical Exam: General: Well-appearing, no acute distress HENT: [place], AT Eyes: EOMI, no scleral icterus Respiratory: Clear to auscultation bilaterally.  No crackles, wheezing or rales Cardiovascular: RRR, -M/R/G, no JVD Extremities:-Edema,-tenderness Neuro: AAO x4, CNII-XII grossly intact Psych: Normal mood, normal affect  Data Reviewed:  Imaging: CXR 03/24/2021-normal chest x-ray.  No infiltrate, effusion or edema CT Chest 09/17/21 - Mild centrilobular and paraseptal emphysema  PFT: 09/22/21 FVC 4.20 (79%) FEV1 2.96 (69%) Ratio 65  TLC 101% RV 164% TV/TLC 162% DLCO 87% Interpretation: Moderate obstructive defect with significant bronchodilator response in FVC and FEV1. Air trapping present.  Normal gas exchange.   Labs: CBC    Component Value Date/Time   WBC 13.4 (H) 04/30/2023 0829   RBC 4.42 04/30/2023 0829   HGB 13.7 04/30/2023 0829   HCT 40.5 04/30/2023 0829   PLT 222 04/30/2023 0829   MCV 91.6 04/30/2023 0829   MCH 31.0 04/30/2023 0829   MCHC 33.8 04/30/2023 0829   RDW 12.6 04/30/2023 0829   LYMPHSABS 1.0 04/30/2023 0829   MONOABS 0.3 04/30/2023 0829   EOSABS 0.0 04/30/2023 0829   BASOSABS 0.0 04/30/2023 0829      Assessment & Plan:   Discussion: 43 year old male former smoker with asthma-COPD, hx COVID 2021 who presents for COPD-asthma overlap. Not controlled on current Symbicort . Proved by recurrent sinusitis issues. Discussed clinical course and management of COPD/asthma including bronchodilator regimen, preventive care including vaccinations and action plan for exacerbation. Assessment & Plan Recurrent sinusitis --REFER to ENT --START  atrovent  1-2 sprays per nostril twice a day as needed --Continue flonase  1-2 sprays once a day as needed --Neti pot every 2-3 days for nasal cleansing --AVOID Afrin --Augmentin  ordered  COPD with asthma (HCC) Uncontrolled with frequent exacerbations preceded by sinusitis --STOP Symbicort  --START brand name Advair  HFA 230-21 mcg TWO puffs in the morning and evening. Rinse mouth out after use --CONTINUE Albuterol  AS NEEDED for shortness of breath or wheezing --Discussed vaccines. Recommend influenza. Patient will considering it.  Asthma Action Plan Use Albuterol  for worsening shortness of breath, wheezing and cough. If you symptoms do not improve in 24-48 hours, please our office for evaluation and/or prednisone  taper.    Health Maintenance Immunization History  Administered Date(s) Administered   Influenza, Seasonal, Injecte, Preservative Fre 05/06/2023   PNEUMOCOCCAL CONJUGATE-20 10/08/2023   Tdap 11/26/2020   CT Lung Screen - not qualified due to age. Re-evaluate when 43 years old.  Orders Placed This  Encounter  Procedures   Ambulatory referral to ENT    Referral Priority:   Routine    Referral Type:   Consultation    Referral Reason:   Specialty Services Required    Requested Specialty:   Otolaryngology    Number of Visits Requested:   1   Meds ordered this encounter  Medications   ipratropium (ATROVENT ) 0.03 % nasal spray    Sig: Place 2 sprays into both nostrils every 12 (twelve) hours.    Dispense:  30 mL    Refill:  2   fluticasone -salmeterol (ADVAIR  HFA) 230-21 MCG/ACT inhaler    Sig: Inhale 2 puffs into the lungs 2 (two) times daily.    Dispense:  12 g    Refill:  5    Brand name preferred OK to substitute for generic if insurance doesn't cover    Return in about 3 months (around 06/24/2024).   I have spent a total time of 35-minutes on the day of the appointment including chart review, data review, collecting history, coordinating care and discussing medical diagnosis and plan with the patient/family. Past medical history, allergies, medications were reviewed. Pertinent imaging, labs and tests included in this note have been reviewed and interpreted independently by me.  John Williamsen Slater Staff, MD [facility] 03/24/2024 9:15 AM  Office Number [phone]

## 2024-03-31 ENCOUNTER — Encounter (INDEPENDENT_AMBULATORY_CARE_PROVIDER_SITE_OTHER): Payer: Self-pay

## 2024-04-06 ENCOUNTER — Other Ambulatory Visit (HOSPITAL_BASED_OUTPATIENT_CLINIC_OR_DEPARTMENT_OTHER): Payer: Self-pay

## 2024-04-07 ENCOUNTER — Ambulatory Visit: Admitting: Family Medicine

## 2024-04-21 ENCOUNTER — Encounter (INDEPENDENT_AMBULATORY_CARE_PROVIDER_SITE_OTHER): Payer: Self-pay

## 2024-05-03 ENCOUNTER — Other Ambulatory Visit (HOSPITAL_BASED_OUTPATIENT_CLINIC_OR_DEPARTMENT_OTHER): Payer: Self-pay

## 2024-06-05 ENCOUNTER — Ambulatory Visit: Admitting: Family Medicine

## 2024-06-19 ENCOUNTER — Ambulatory Visit (HOSPITAL_BASED_OUTPATIENT_CLINIC_OR_DEPARTMENT_OTHER): Admitting: Pulmonary Disease

## 2024-07-10 ENCOUNTER — Ambulatory Visit: Admitting: Family Medicine
